# Patient Record
Sex: Female | Born: 1965 | Race: White | Hispanic: No | Marital: Married | Smoking: Never smoker
Health system: Southern US, Community
[De-identification: ages and names within clinical notes are randomized; demographics above are authoritative.]

## PROBLEM LIST (undated history)

## (undated) DIAGNOSIS — Z9889 Other specified postprocedural states: Secondary | ICD-10-CM

## (undated) DIAGNOSIS — N951 Menopausal and female climacteric states: Secondary | ICD-10-CM

## (undated) DIAGNOSIS — Z87442 Personal history of urinary calculi: Secondary | ICD-10-CM

## (undated) DIAGNOSIS — N200 Calculus of kidney: Secondary | ICD-10-CM

## (undated) DIAGNOSIS — I471 Supraventricular tachycardia, unspecified: Secondary | ICD-10-CM

## (undated) DIAGNOSIS — I10 Essential (primary) hypertension: Secondary | ICD-10-CM

## (undated) DIAGNOSIS — N1330 Unspecified hydronephrosis: Secondary | ICD-10-CM

## (undated) DIAGNOSIS — N390 Urinary tract infection, site not specified: Secondary | ICD-10-CM

## (undated) DIAGNOSIS — J45909 Unspecified asthma, uncomplicated: Secondary | ICD-10-CM

## (undated) DIAGNOSIS — T782XXA Anaphylactic shock, unspecified, initial encounter: Secondary | ICD-10-CM

## (undated) DIAGNOSIS — Z91018 Allergy to other foods: Secondary | ICD-10-CM

## (undated) DIAGNOSIS — R112 Nausea with vomiting, unspecified: Secondary | ICD-10-CM

## (undated) DIAGNOSIS — L720 Epidermal cyst: Secondary | ICD-10-CM

## (undated) HISTORY — PX: OTHER SURGICAL HISTORY: SHX169

## (undated) HISTORY — DX: Urinary tract infection, site not specified: N39.0

## (undated) HISTORY — DX: Unspecified asthma, uncomplicated: J45.909

## (undated) HISTORY — PX: TUBAL LIGATION: SHX77

## (undated) HISTORY — PX: ENDOMETRIAL ABLATION: SHX621

## (undated) HISTORY — DX: Anaphylactic shock, unspecified, initial encounter: T78.2XXA

## (undated) HISTORY — DX: Menopausal and female climacteric states: N95.1

## (undated) HISTORY — DX: Allergy to other foods: Z91.018

## (undated) HISTORY — DX: Epidermal cyst: L72.0

---

## 1999-06-18 ENCOUNTER — Encounter: Payer: Self-pay | Admitting: Emergency Medicine

## 1999-06-18 ENCOUNTER — Emergency Department (HOSPITAL_COMMUNITY): Admission: EM | Admit: 1999-06-18 | Discharge: 1999-06-18 | Payer: Self-pay | Admitting: Emergency Medicine

## 2000-11-13 ENCOUNTER — Ambulatory Visit (HOSPITAL_COMMUNITY): Admission: RE | Admit: 2000-11-13 | Discharge: 2000-11-13 | Payer: Self-pay | Admitting: *Deleted

## 2000-11-13 ENCOUNTER — Encounter: Payer: Self-pay | Admitting: Obstetrics and Gynecology

## 2002-08-01 ENCOUNTER — Observation Stay (HOSPITAL_COMMUNITY): Admission: AD | Admit: 2002-08-01 | Discharge: 2002-08-02 | Payer: Self-pay | Admitting: Obstetrics & Gynecology

## 2003-02-01 ENCOUNTER — Inpatient Hospital Stay (HOSPITAL_COMMUNITY): Admission: RE | Admit: 2003-02-01 | Discharge: 2003-02-03 | Payer: Self-pay | Admitting: Obstetrics and Gynecology

## 2006-01-28 ENCOUNTER — Ambulatory Visit (HOSPITAL_COMMUNITY): Admission: RE | Admit: 2006-01-28 | Discharge: 2006-01-28 | Payer: Self-pay | Admitting: Obstetrics and Gynecology

## 2007-01-12 ENCOUNTER — Emergency Department (HOSPITAL_COMMUNITY): Admission: EM | Admit: 2007-01-12 | Discharge: 2007-01-12 | Payer: Self-pay | Admitting: Emergency Medicine

## 2007-01-17 ENCOUNTER — Emergency Department (HOSPITAL_COMMUNITY): Admission: EM | Admit: 2007-01-17 | Discharge: 2007-01-17 | Payer: Self-pay | Admitting: Emergency Medicine

## 2007-04-16 ENCOUNTER — Other Ambulatory Visit: Admission: RE | Admit: 2007-04-16 | Discharge: 2007-04-16 | Payer: Self-pay | Admitting: Obstetrics and Gynecology

## 2007-04-22 ENCOUNTER — Ambulatory Visit (HOSPITAL_COMMUNITY): Admission: RE | Admit: 2007-04-22 | Discharge: 2007-04-22 | Payer: Self-pay | Admitting: Obstetrics & Gynecology

## 2007-07-02 ENCOUNTER — Ambulatory Visit (HOSPITAL_COMMUNITY): Admission: RE | Admit: 2007-07-02 | Discharge: 2007-07-02 | Payer: Self-pay | Admitting: Obstetrics and Gynecology

## 2007-07-15 ENCOUNTER — Ambulatory Visit (HOSPITAL_COMMUNITY): Admission: RE | Admit: 2007-07-15 | Discharge: 2007-07-15 | Payer: Self-pay | Admitting: Obstetrics and Gynecology

## 2007-07-15 ENCOUNTER — Encounter: Payer: Self-pay | Admitting: Obstetrics and Gynecology

## 2008-03-16 ENCOUNTER — Ambulatory Visit (HOSPITAL_COMMUNITY): Admission: RE | Admit: 2008-03-16 | Discharge: 2008-03-16 | Payer: Self-pay | Admitting: Family Medicine

## 2008-04-11 ENCOUNTER — Emergency Department (HOSPITAL_COMMUNITY): Admission: EM | Admit: 2008-04-11 | Discharge: 2008-04-11 | Payer: Self-pay | Admitting: Emergency Medicine

## 2008-04-12 ENCOUNTER — Emergency Department (HOSPITAL_COMMUNITY): Admission: EM | Admit: 2008-04-12 | Discharge: 2008-04-12 | Payer: Self-pay | Admitting: Emergency Medicine

## 2008-04-14 ENCOUNTER — Inpatient Hospital Stay (HOSPITAL_COMMUNITY): Admission: AD | Admit: 2008-04-14 | Discharge: 2008-04-19 | Payer: Self-pay | Admitting: Pulmonary Disease

## 2008-04-14 ENCOUNTER — Ambulatory Visit: Payer: Self-pay | Admitting: Pulmonary Disease

## 2008-04-14 ENCOUNTER — Encounter: Payer: Self-pay | Admitting: Emergency Medicine

## 2008-04-26 ENCOUNTER — Ambulatory Visit: Payer: Self-pay | Admitting: Internal Medicine

## 2008-04-26 DIAGNOSIS — J189 Pneumonia, unspecified organism: Secondary | ICD-10-CM | POA: Insufficient documentation

## 2008-04-26 DIAGNOSIS — M549 Dorsalgia, unspecified: Secondary | ICD-10-CM | POA: Insufficient documentation

## 2008-04-26 DIAGNOSIS — B370 Candidal stomatitis: Secondary | ICD-10-CM | POA: Insufficient documentation

## 2008-04-26 DIAGNOSIS — N12 Tubulo-interstitial nephritis, not specified as acute or chronic: Secondary | ICD-10-CM | POA: Insufficient documentation

## 2008-04-26 DIAGNOSIS — A419 Sepsis, unspecified organism: Secondary | ICD-10-CM | POA: Insufficient documentation

## 2008-04-26 DIAGNOSIS — N200 Calculus of kidney: Secondary | ICD-10-CM | POA: Insufficient documentation

## 2008-04-26 DIAGNOSIS — R0989 Other specified symptoms and signs involving the circulatory and respiratory systems: Secondary | ICD-10-CM | POA: Insufficient documentation

## 2008-04-26 DIAGNOSIS — R0609 Other forms of dyspnea: Secondary | ICD-10-CM

## 2008-05-10 ENCOUNTER — Ambulatory Visit: Payer: Self-pay | Admitting: Pulmonary Disease

## 2008-05-24 ENCOUNTER — Ambulatory Visit (HOSPITAL_COMMUNITY): Admission: RE | Admit: 2008-05-24 | Discharge: 2008-05-24 | Payer: Self-pay | Admitting: Urology

## 2009-01-29 ENCOUNTER — Emergency Department (HOSPITAL_COMMUNITY): Admission: EM | Admit: 2009-01-29 | Discharge: 2009-01-29 | Payer: Self-pay | Admitting: Family Medicine

## 2009-01-31 ENCOUNTER — Emergency Department (HOSPITAL_COMMUNITY): Admission: EM | Admit: 2009-01-31 | Discharge: 2009-01-31 | Payer: Self-pay | Admitting: Emergency Medicine

## 2009-03-22 ENCOUNTER — Ambulatory Visit (HOSPITAL_COMMUNITY): Admission: RE | Admit: 2009-03-22 | Discharge: 2009-03-22 | Payer: Self-pay | Admitting: Obstetrics and Gynecology

## 2009-03-30 ENCOUNTER — Encounter: Admission: RE | Admit: 2009-03-30 | Discharge: 2009-03-30 | Payer: Self-pay | Admitting: Obstetrics and Gynecology

## 2010-01-23 ENCOUNTER — Emergency Department (HOSPITAL_COMMUNITY): Admission: EM | Admit: 2010-01-23 | Discharge: 2010-01-23 | Payer: Self-pay | Admitting: Emergency Medicine

## 2010-06-29 ENCOUNTER — Encounter: Payer: Self-pay | Admitting: Cardiology

## 2010-06-30 DIAGNOSIS — I498 Other specified cardiac arrhythmias: Secondary | ICD-10-CM | POA: Insufficient documentation

## 2010-07-02 ENCOUNTER — Encounter: Payer: Self-pay | Admitting: Obstetrics and Gynecology

## 2010-07-03 ENCOUNTER — Ambulatory Visit
Admission: RE | Admit: 2010-07-03 | Discharge: 2010-07-03 | Payer: Self-pay | Source: Home / Self Care | Attending: Cardiology | Admitting: Cardiology

## 2010-07-03 ENCOUNTER — Encounter: Payer: Self-pay | Admitting: Cardiology

## 2010-07-03 DIAGNOSIS — R002 Palpitations: Secondary | ICD-10-CM | POA: Insufficient documentation

## 2010-07-11 ENCOUNTER — Ambulatory Visit (HOSPITAL_COMMUNITY)
Admission: RE | Admit: 2010-07-11 | Discharge: 2010-07-11 | Payer: Self-pay | Source: Home / Self Care | Attending: Cardiology | Admitting: Cardiology

## 2010-07-11 ENCOUNTER — Ambulatory Visit: Admission: RE | Admit: 2010-07-11 | Discharge: 2010-07-11 | Payer: Self-pay | Source: Home / Self Care

## 2010-07-11 DIAGNOSIS — R002 Palpitations: Secondary | ICD-10-CM

## 2010-07-13 NOTE — Assessment & Plan Note (Signed)
Summary: np6/svt-mb   Visit Type:  Initial Consult Primary Provider:  Dr. Gerda Diss  CC:  Pt has no complaints today. pt has lot of stress right now..  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 Gerda Diss for palpitations and question of SVT.  She has never had any previous cardiac problems. On January 3, she woke up and felt unsteady on her gait. By the time she got around to the end of her bed she began to feel her heart race. It was regular and started suddenly. After she was able to bear down it broke.  She has not had a recurrence. She saw her primary care to check blood work. I do not have those results. She was found to be hypertensive and she was started on metoprolol tartrate 25 mg a day. Her pressures have been about 1:30 to 140/80.  She denies next is caffeine use, does not smoke, rarely drinks more than a glass of wine a couple days a week, and no over-the-counter meds used including pseudoephedrine or decongestants.  She struggles with her weight and she rarely exercises. She has been a lot of domestic stress with her son.  The morning that she had the SVT she had been working a lot of hours over the holidays.  EKG today demonstrates normal sinus rhythm with normal intervals.  I just received her laboratory data shows hemoglobin 13.8, potassium 4.1, blood sugar 108, cholesterol 202, triglycerides at 212, LDL 120, HDL 40, normal LFTs, TSH was normal.  Current Medications (verified): 1)  Ibuprofen 800 Mg Tabs (Ibuprofen) .Marland Kitchen.. 1 Tablet Two Times A Day By Mouth As Needed 2)  Aspirin 81 Mg Tbec (Aspirin) .... Take One Tablet By Mouth Daily 3)  Metoprolol Tartrate 25 Mg Tabs (Metoprolol Tartrate) .... Once Daily  Allergies: 1)  ! Sulfa 2)  ! * Onions  Past History:  Past Medical History: Last updated: 06/30/2010 Current Problems:  SUPRAVENTRICULAR TACHYCARDIA  (ICD-427.89) PNEUMONIA (ICD-486) Hx of RESPIRATORY DISTRESS (ICD-786.09) BACK PAIN, CHRONIC (ICD-724.5) THRUSH (ICD-112.0) Hx of SEPSIS (ICD-995.91) Hx of PYELONEPHRITIS (ICD-590.80) NEPHROLITHIASIS (ICD-592.0)     ALLERGIES:  Listed as SULFA.      FAMILY HISTORY:  Positive for nephrolithiasis.   Past Surgical History: Last updated: 06/30/2010  1. Cystoscopy.   2. Removal of right ureteral stent.   3. Right ureteroscopy with stone removal.      SURGEON:  Heloise Purpura, MD.   Family History: Last updated: 04/26/2008 positive for kidney stone positive for heart disease positive for hypertension positive for breast cancer  Social History: Last updated: 04/26/2008 never smoked children rare etoh  Risk Factors: Smoking Status: never (04/26/2008)  Review of Systems       negative other than history of present illness  Vital Signs:  Patient profile:   45 year old female Height:      62 inches Weight:      166.75 pounds BMI:     30.61 Pulse rate:   61 / minute Pulse rhythm:   irregular BP sitting:   148 / 80  (left arm) Cuff size:   large  Vitals Entered By: Danielle Rankin, CMA (July 03, 2010 3:09 PM)  Physical Exam  General:  no acute distress, overweight Head:  normocephalic and atraumatic Eyes:  PERRLA/EOM intact; conjunctiva and lids normal. Neck:  Neck supple, no JVD. No masses, thyromegaly or abnormal cervical nodes. Chest Jalene Demo:  no deformities  or breast masses noted Lungs:  Clear bilaterally to auscultation and percussion. Heart:  PMI nondisplaced regular rate and rhythm normal S1-S2, no click or murmur. Carotids show cecal by without bruits Abdomen:  soft, good bowel sounds, no bruit Msk:  Back normal, normal gait. Muscle strength and tone normal. Pulses:  pulses normal in all 4 extremities Extremities:  No clubbing or cyanosis. Neurologic:  Alert and oriented x 3.   Impression & Recommendations:  Problem # 1:  SUPRAVENTRICULAR TACHYCARDIA  (ICD-427.89) Assessment New Her event sounds like SVT. She has not had a recurrence. Would check her thyroid studies and other blood work from primary care. Will obtain 2-D echocardiogram to rule out structural heart disease. I will increase her metoprolol tartrate b.i.d. If she begins to have more frequent episodes, or a prolonged episode, we'll try to obtain a monitor or EKG during her event respectively. Have encouraged exercise for stress reduction. Her updated medication list for this problem includes:    Aspirin 81 Mg Tbec (Aspirin) .Marland Kitchen... Take one tablet by mouth daily    Metoprolol Tartrate 25 Mg Tabs (Metoprolol tartrate) .Marland Kitchen... Take one tablet by mouth twice a day  Orders: EKG w/ Interpretation (93000)  Problem # 2:  PALPITATIONS (ICD-785.1) Assessment: New  Her updated medication list for this problem includes:    Aspirin 81 Mg Tbec (Aspirin) .Marland Kitchen... Take one tablet by mouth daily    Metoprolol Tartrate 25 Mg Tabs (Metoprolol tartrate) .Marland Kitchen... Take one tablet by mouth twice a day  Orders: Echocardiogram (Echo)  Patient Instructions: 1)  Your physician recommends that you schedule a follow-up appointment in: 12 months or as needed 2)  Your physician has recommended you make the following change in your medication: Increase metoprolol tartrate to 25 mg by mouth two times a day 3)  Your physician has requested that you have an echocardiogram.  Echocardiography is a painless test that uses sound waves to create images of your heart. It provides your doctor with information about the size and shape of your heart and how well your heart's chambers and valves are working.  This procedure takes approximately one hour. There are no restrictions for this procedure. Prescriptions: METOPROLOL TARTRATE 25 MG TABS (METOPROLOL TARTRATE) Take one tablet by mouth twice a day  #60 x 11   Entered by:   Dossie Arbour, RN, BSN   Authorized by:   Gaylord Shih, MD, [facility]   Signed by:   Dossie Arbour, RN, BSN on 07/03/2010   Method used:   Electronically to        [facility]* (retail)       [address]       [address] Shipping/mailing       [place], [place]  [address]       Ph: [phone]       Fax: [phone]   RxID:   [id number]

## 2010-09-16 LAB — WOUND CULTURE: Gram Stain: NONE SEEN

## 2010-09-16 LAB — BASIC METABOLIC PANEL
BUN: 10 mg/dL (ref 6–23)
Chloride: 105 mEq/L (ref 96–112)
GFR calc non Af Amer: >60 mL/min (ref >60)
Glucose, Bld: 105 mg/dL — ABNORMAL HIGH (ref 70–99)
Sodium: 139 mEq/L (ref 135–145)

## 2010-10-08 IMAGING — CR DG CHEST 1V PORT
1 series · 1 of 1 positions shown · non-contrast
Comparison: 04/14/2008 study

CLINICAL DATA: History given of sepsis and pneumonia.

PORTABLE CHEST - 1 VIEW

[view not recorded]
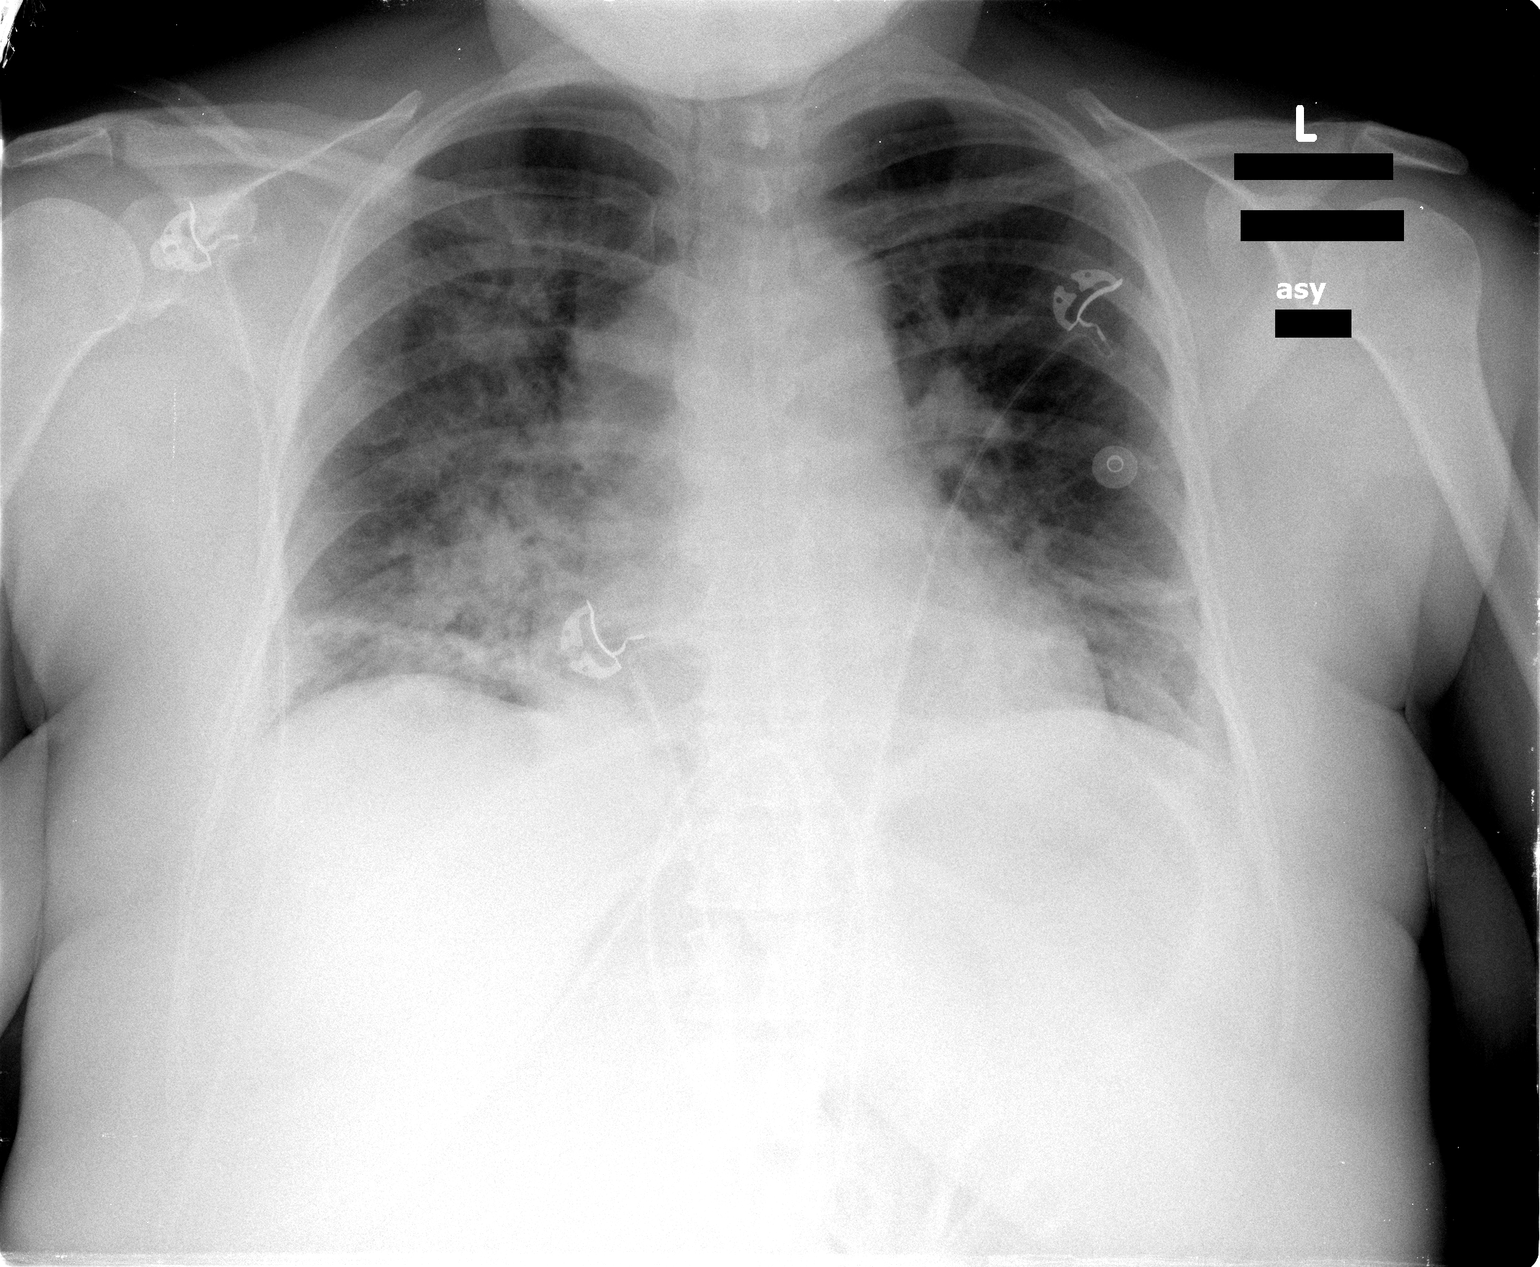

[1 of 1 positions shown; findings below may reference images not displayed]

FINDINGS: There is moderate enlargement of the cardiac silhouette.
Patchy infiltrative densities and airspace opacities are present
throughout the right lung with the densest involvement in the right
lung base with associated right basilar atelectasis.  Subsegmental
atelectasis is present in the lower left lung zone.  No left
pleural effusion is seen.  There is slight indistinctness of the
right costophrenic angle which may reflect a trace amount of right
pleural effusion.  Osteophytes are seen in the spine.
IMPRESSION: There is moderate enlargement of the cardiac silhouette.  Pulmonary
infiltrative densities and airspace disease opacities have
increased in the right lung since prior study.  There is associated
right basilar atelectasis with possible trace amount of right
pleural effusion.  There is now subsegmental atelectasis in the
lower left lung zone.

## 2010-10-09 IMAGING — CR DG ABD PORTABLE 1V
1 series · 1 of 1 positions shown · non-contrast
Comparison: 04/12/2008

CLINICAL DATA: Ileus.  Sepsis.

ABDOMEN - 1 VIEW

[view not recorded]
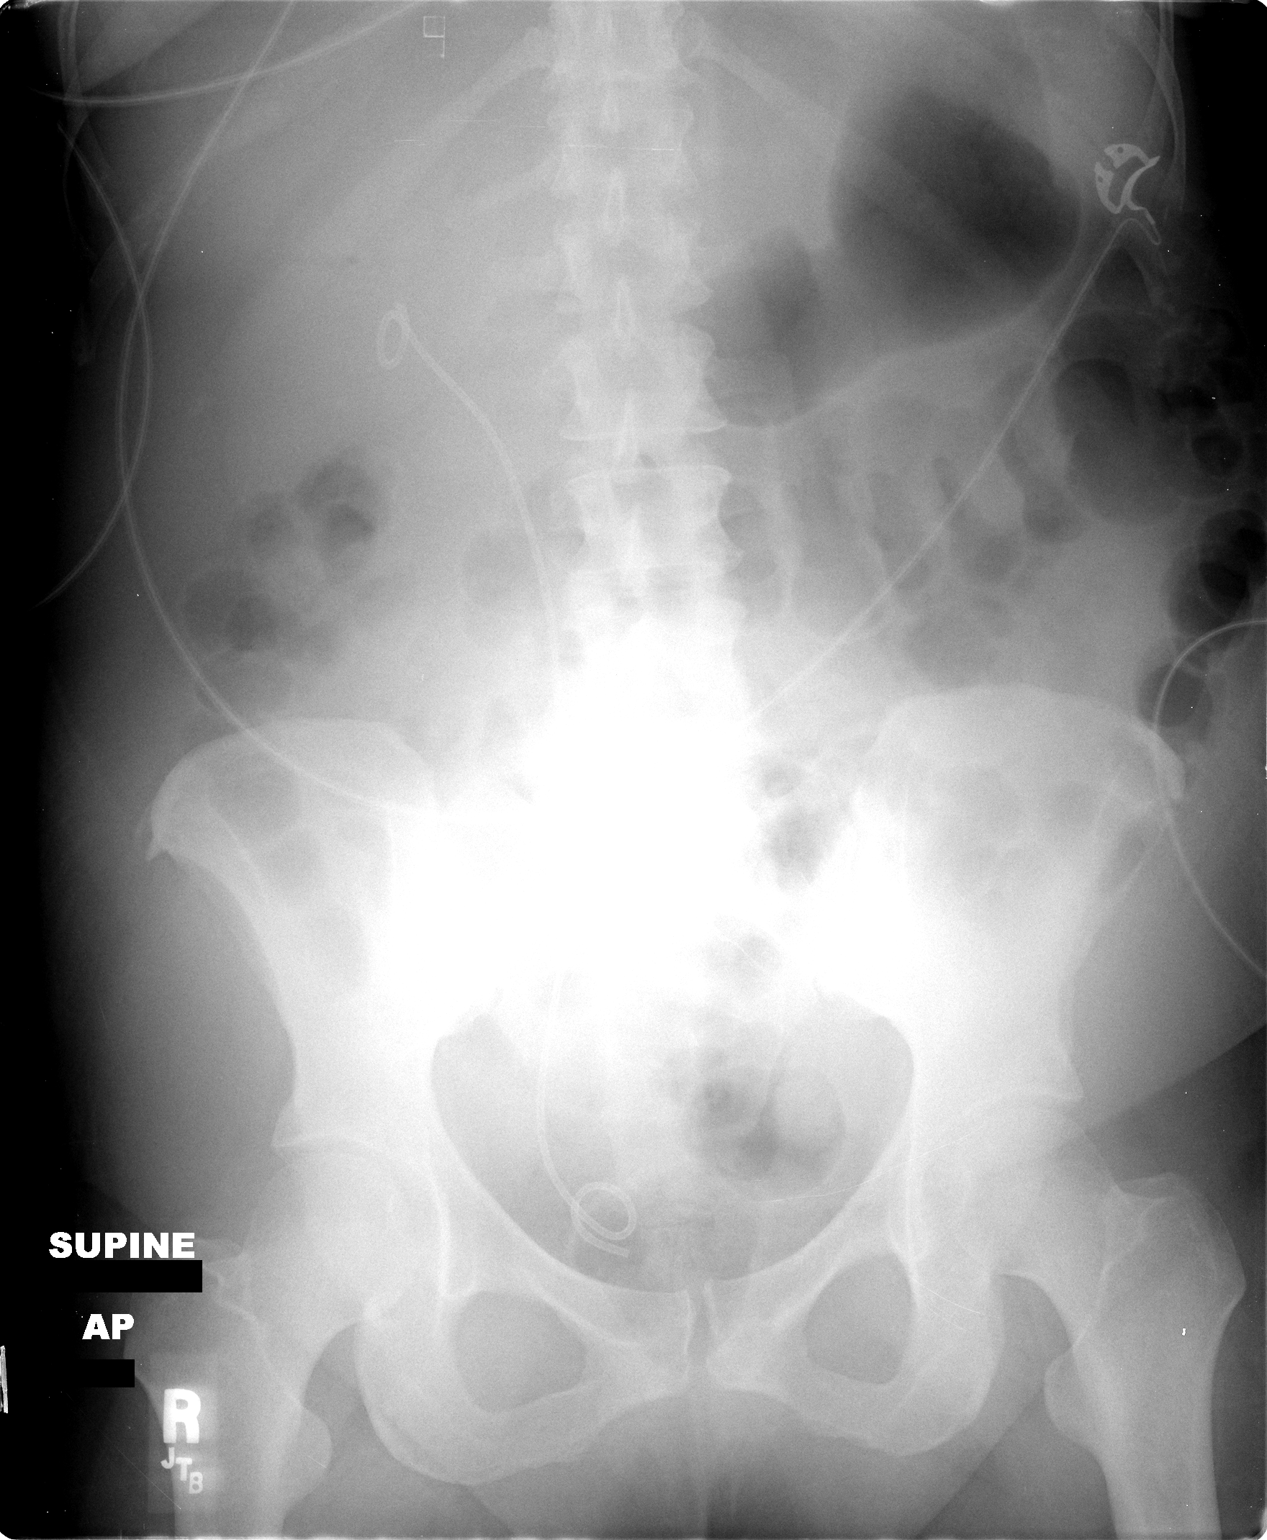

[1 of 1 positions shown; findings below may reference images not displayed]

FINDINGS: There is been placement of a right ureteral stent since
prior study.  The bowel gas pattern is normal.  There is no
evidence of dilated bowel loops.
IMPRESSION: Normal bowel gas pattern.  Right ureteral stent in appropriate
position.

## 2010-10-24 NOTE — Discharge Summary (Signed)
NAMEKORISSA, Harrison NO.:  [id number]   MEDICAL RECORD NO.:  [id number]          PATIENT TYPE:  INP   LOCATION:  5114                         FACILITY:  [facility]   PHYSICIAN:  Oretha Milch, MD      DATE OF BIRTH:  02-02-1966   DATE OF ADMISSION:  04/14/2008  DATE OF DISCHARGE:  04/19/2008                               DISCHARGE SUMMARY   DISCHARGE DIAGNOSES:  1. E. coli pyelonephritis.  2. Renal calculi.  3. Systemic inflammatory response syndrome.  4. Thrush.   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  She underwent examination  at a local emergency room, went home, developed fevers, chills, and  sweats, and came in and required admission for increasing fever,  hypotension.   PAST MEDICAL HISTORY:  Significant for chronic back pain.   ALLERGIES:  Listed as SULFA.   FAMILY HISTORY:  Positive for nephrolithiasis.   LABORATORY DATA:  Chest x-ray shows patchy opacity in the right lung  concerning for infectious etiology, which may be a component of mild  edema.  Temperature was 98.2 on the day of discharge, pulse 61,  respirations 18, systolic is 179, diastolic is 92, O2 saturation is 98%  on room air.  WBCs 6.5, hemoglobin 9.5, hematocrit 27.6, platelets  144,000, INR 1.1, PT 38.  CBG is 120, sodium 141, potassium 3.7,  chloride 106, CO2 29, glucose 123, BUN 5, creatinine 0.72, calcium 9.2,  phosphorus 3.8, magnesium 1.7.   HOSPITAL COURSE BY DISCHARGE DIAGNOSIS:  1. Pneumonia and urinary tract infection with E. coli.  She was      admitted to [facility] and started on antimicrobial      therapy.  Culture data is remarkable for urine culture showing E.      coli.  Legionella urine was negative.  Blood cultures were negative      x2.  She was admitted for pneumonia and urinary tract infection,      started antimicrobial therapy and responded well to therapy.  She  did require fluid resuscitation for systemic inflammatory response      syndrome.  She reached maximal hospital benefit by April 19, 2008      and was to be discharged home.  She will continue another 14 days      of antimicrobial therapy.  2. Nephrolithiasis.  She has a right kidney stone.  She was evaluated      by Heloise Purpura, M.D.  She underwent a stent per him to the right.      She will be continued on antibiotics for approximately 2 weeks and      will follow up with him on an outpatient basis.  3. Thrush.  She was started on nystatin and then changed to Diflucan      100 mg p.o. daily for 7 days and to follow up with Dr. Vassie Loll in the      near future.   DISCHARGE MEDICATIONS:  1. Ceftin 500 mg  1 b.i.d. for the next 14 days.  2. Tylenol 650 mg every 6 hours as needed.  3. Diflucan 100 mg 1 p.o. x7 days.   She has a followup appointment with nurse practitioner Parrett on  April 26, 2008 at 10:30 a.m. and with Dr. Laverle Patter in the near future  for evaluation of her stent.   DISPOSITION/CONDITION ON DISCHARGE:  Improved.  She will be on a heart-  healthy, low-sodium diet.  She has instructions as far as her stent per  Dr. Laverle Patter.  She is discharged home in improved condition.      Devra Dopp, MSN, ACNP      Oretha Milch, MD  Electronically Signed    SM/MEDQ  D:  04/19/2008  T:  04/19/2008  Job:  [id number]

## 2010-10-24 NOTE — Op Note (Signed)
Judy Harrison, Judy Harrison NO.:  [id number]   MEDICAL RECORD NO.:  [id number]          PATIENT TYPE:  AMB   LOCATION:  DAY                           FACILITY:  [facility]   PHYSICIAN:  Tilda Burrow, M.D. DATE OF BIRTH:  19-May-1966   DATE OF PROCEDURE:  07/15/2007  DATE OF DISCHARGE:                               OPERATIVE REPORT   PREOPERATIVE DIAGNOSES:  1. Left ovarian dermoid.  2. Menorrhagia.   POSTOPERATIVE DIAGNOSES:  1. Left ovarian dermoid.  2. Menorrhagia.   PROCEDURES:  1. Laparoscopic left ovarian cystectomy for dermoid.  2. Hysteroscopy and endometrial ablation.   SURGEON:  Ferguson.   ASSISTANT:  Blackwell, CST.   ANESTHESIA:  General  Idacavage, CRNA.   COMPLICATIONS:  None.   FINDINGS:  Anterior retroverted retroflexed uterus sounding to 9 cm.  Smooth cystic appearance to the left ovary with dermoid within its  central portions. Shelled out nicely.   DETAILS OF PROCEDURE:  The patient was taken to the operating room  prepped and draped for a combined abdominal and vaginal procedure with  the Foley catheter inserted and clear urine obtained. A Hulka tenaculum  was attached to the cervix for uterine manipulation. An infraumbilical  vertical skin incision was made as well as a transverse suprapubic 1 cm  incision.  Veress needle was introduced through the umbilicus without  difficulty. Pneumoperitoneum achieved. Under 6 mmHg pressure after a  water droplet test confirmed peritoneal location of needle. The patient  had easy insufflation of the abdomen, insertion of the laparoscopic  trocar under direct visualization and inspection of the pelvis.  Suprapubic and right lower quadrant trocars were placed under direct  visualization, 5 mm in the right lower quadrant and a 12 mm at the  suprapubic area. Attention was directed to the left ovary, which was  further documented as being visually smooth, slightly enlarged with no  grossly abnormal  findings.  Aspiration of cystic structure drained  approximately 10 cc of liquid waxy material, which then coalesced when  allowed to cool to room air. The ovary was opened on its medial surface,  the cyst grasped and using traction and counter traction, we were able  to peel the cystic structure out of the center of the ovary.  Unipolar  cautery was used as necessary to complete hemostasis as we completed the  extraction process.  There was minimal bleeding. The cyst was removed  laparoscopically and photo #4 shows the postprocedure appearance of the  ovary.  The abdomen was deflated. Instruments were removed. Subcuticular  4-0 Dexon closure of all three skin incisions performed after 0 Vicryl  closure of the suprapubic and umbilical sites under careful direct  visualization.   Endometrial ablation. Attention was then moved to the perineum, where  the speculum was inserted. The cervix was grasped with a single tooth  tenaculum, sounded in the anteverted, retroflexed flexion to 9 cm with  dilation to 23 [place] allowing introduction of the rigid hysteroscope.  Photos 1 and 2 showed the cornu of the uterus, which was very thin  without  any polyps or gross abnormalities.  The patient was considered  an excellent candidate for ablation. She had just finished her menses,  was on Progestin, so there was no significant tissue build up. The  endometrial ablation Gyne-Care ThermaChoice Three device was inserted  and insufflated with 12 cc of D5W in the eight minute thermal sequence  performed. 19 cc of Marcaine with Epinephrine was injected in the  paracervical block and the procedure considered successfully completed.  Sponge and needle counts were correct. The patient went to the recovery  room in good condition.  Discharge medicines include Vicodin times 20  tablets and Motrin times 20 tablets.      Tilda Burrow, M.D.  Electronically Signed     JVF/MEDQ  D:  07/15/2007  T:   07/15/2007  Job:  [id number]   cc:   [facility]

## 2010-10-24 NOTE — Consult Note (Signed)
Judy Harrison, Judy Harrison NO.:  [id number]   MEDICAL RECORD NO.:  [id number]          PATIENT TYPE:  INP   LOCATION:  2101                         FACILITY:  [facility]   PHYSICIAN:  Heloise Purpura, MD      DATE OF BIRTH:  04-Oct-1965   DATE OF CONSULTATION:  04/14/2008  DATE OF DISCHARGE:                                 CONSULTATION   REASON FOR CONSULTATION:  1. Right ureteral calculus.  2. Fever.   HISTORY:  Judy Harrison is a 45 year old female seen in consultation at the  request of Dr. Billy Fischer.  She has been having right-sided flank  pain for approximately 48-72 hours.  She did undergo a CT scan after  presenting to the emergency department on Sunday, which demonstrated a 3-  mm proximal right ureteral calculus with associated hydronephrosis.  Over the next 48 hours, she continued to have significant pain and did  begin to develop fever up to 101 degrees Fahrenheit.  She again  presented to the emergency department earlier today due to her  persistent fever and the fact that her pain was becoming less controlled  and she was feeling poorly overall.  Upon evaluation in the emergency  department, she was found to be tachycardiac with a heart rate in the  140s.  She was also found to be febrile with a temperature of 101  degrees Fahrenheit.  Based on concern for sepsis, she was admitted to  the intensive care unit under the Critical Care Service.  There was also  concern for respiratory distress versus pneumonia.  She denies a prior  history of kidney stones, hematuria, GU malignancy/trauma/prior surgery  except for tubal ligation and C-section.  She currently describes her  pain is moderately severe with some radiation to her right upper  abdomen.   PAST MEDICAL HISTORY:  Chronic back pain.   PAST SURGICAL HISTORY:  Tubal ligation and C-section.   MEDICATIONS:  1. Aspirin.  2. Zyrtec.   ALLERGIES:  SULFA.   FAMILY HISTORY:  Positive for nephrolithiasis.   SOCIAL HISTORY:  She denies tobacco use.   REVIEW OF SYSTEMS:  Positive for cough and mild shortness of breath as  well as fever and right-sided flank pain.  All other systems are  reviewed and otherwise negative.   PHYSICAL EXAMINATION:  VITALS:  Temperature currently is 99.7 degrees  Fahrenheit.  Her temperature earlier today was 101 degrees Fahrenheit.  Heart rate 106 and blood pressure 158/78.  CONSTITUTIONAL:  The patient is a well-nourished, well-developed, age-  appropriate female in no acute distress.  HEENT:  Normocephalic and atraumatic.  NECK:  No JVD.  CARDIOVASCULAR:  Regular rate and rhythm without obvious murmurs.  LUNGS:  Clear bilaterally.  ABDOMEN:  Soft and nondistended without abdominal masses.  She does have  mild-to-moderate right sided abdominal pain without rebound, tenderness,  or guarding.  She does have mild-to-moderate right-sided CVA tenderness.  EXTREMITIES:  No edema.  NEUROLOGIC:  No focal deficits.  GU:  Normal external female genitalia.  PSYCHIATRIC:  Normal mood and affect.   LABORATORY STUDIES:  Urinalysis demonstrates a pH of 6.0 with too  numerous to count white blood cells, few bacteria, and 7-10 red blood  cells.  Serum creatinine 1.28.  White blood count 2.5, hemoglobin 11.9,  and platelets 103.  Urine pregnancy negative.   RADIOLOGIC IMAGING:  The following studies were independently reviewed.  The patient's stone CT from April 11, 2008, demonstrated a 3-mm right  UPJ calculus with associated hydronephrosis.  Her KUB from April 12, 2008, demonstrates a radiopaque calcification that is consistent with  her stone overlying the expected course of the proximal right ureter.  Her renal ultrasound from April 14, 2008, demonstrates mild right-  sided hydronephrosis consistent with persistent obstruction.   IMPRESSION:  Right ureteral calculus with ureteral obstruction and signs  of sepsis.   PLAN:  I have had a discussion with the  patient and her husband today.  I have recommended that she undergo drainage of her right kidney.  We  have discussed proceeding with ureteral stent versus percutaneous  nephrostomy tube placement.  I have discussed her situation with Dr.  Levy Pupa who is on call for the Critical Care Service this evening.  He thinks that she would be an acceptable candidate to take to the  operating room to proceed with ureteral stent placement.  We may attempt  to perform this under a MAC anesthetic to avoid a general endotracheal  anesthetic pending anesthesia's recommendations.  We have discussed the  potential risks, complications, and alternative options associated with  this procedure.  This will be performed on an urgent basis this evening.      Heloise Purpura, MD  Electronically Signed     LB/MEDQ  D:  04/14/2008  T:  04/15/2008  Job:  [id number]   cc:   Oley Balm. Sung Amabile, MD  Leslye Peer, MD

## 2010-10-24 NOTE — Op Note (Signed)
NAMEDANGELA, HOW NO.:  [id number]   MEDICAL RECORD NO.:  [id number]          PATIENT TYPE:  AMB   LOCATION:  DAY                          FACILITY[facility]   PHYSICIAN:  Heloise Purpura, MD      DATE OF BIRTH:  [birth year]   DATE OF PROCEDURE:  05/24/2008  DATE OF DISCHARGE:                               OPERATIVE REPORT   PREOPERATIVE DIAGNOSIS:  Right ureteropelvic junction calculus.   POSTOPERATIVE DIAGNOSIS:  Right renal calculus.   PROCEDURES:  1. Cystoscopy.  2. Removal of right ureteral stent.  3. Right ureteroscopy with stone removal.   SURGEON:  Heloise Purpura, MD.   ASSISTANT:  Duane Boston, MD.   ANESTHESIA:  LMA.   INDICATION:  Ms. Canty is a 45 year old female, who initially presented  to the hospital in sepsis and was found to have an obstructing right  ureteral calculus.  She was placed on broad-spectrum IV antibiotics  pending her urine culture results and underwent urgent ureteral stent  placement.  She subsequently defervesced and was able to be discharged  from the hospital.  She was treated with Ceftin for 2 weeks.  In  addition, there was some concern regarding pneumonia and she was also  treated for this.  Once she had fully recovered from both her renal and  pulmonary infections, she was felt to be stable to proceed with  definitive stone management.  We discussed options, and she elected to  proceed with the above procedures.  Potential risks, complications, and  alternative options were discussed in detail with the patient and  informed consent was obtained.   DESCRIPTION OF PROCEDURE:  The patient was taken to the operating room  and an LMA was placed.  She was placed in the dorsal lithotomy position,  prepped and draped in the usual sterile fashion, and preoperative  antibiotics were administered.  A preoperative timeout was performed.  A  cystourethroscopy was then performed, which allowed visualization of the  patient's  right ureteral stent.  This was brought out through the  urethral meatus with the aid of the flexible graspers.  A 0.38 sensor  guidewire was then advanced through the stent up into the right renal  pelvis under fluoroscopic guidance.  The semi-rigid ureteroscope was  then used to examine the ureter along the safety guidewire up to the  proximal ureter.  No stone was visualized indicating that the stone  likely had migrated back into the kidney.  Therefore, a second 0.38  Glidewire was inserted through the semi-rigid ureteroscope and up into  the right renal pelvis.  A 6.5-French flexible ureteroscope was then  advanced over the Glidewire into the renal pelvis.  The patient's stone  was identified and was able to be removed with the aid of a Nitanol  basket.  The patient was noted to only have a solitary calculus on her  recent CT scan.  Due to the fact that the procedure was uneventful,  it was decided to leave her ureteral stent out.  Therefore, the  remaining guidewire was removed, and the  patient's bladder was emptied.  She tolerated the procedure well and without complications.  She was  able to be awakened and transferred to the recovery room in satisfactory  condition.      Heloise Purpura, MD  Electronically Signed     LB/MEDQ  D:  05/24/2008  T:  05/24/2008  Job:  [id number]

## 2010-10-24 NOTE — Op Note (Signed)
Judy Harrison, Judy Harrison.:  [id number]   MEDICAL RECORD Harrison.:  [id number]          PATIENT TYPE:  INP   LOCATION:  2101                         FACILITY:  [facility]   PHYSICIAN:  Heloise Purpura, MD      DATE OF BIRTH:  1966/03/24   DATE OF PROCEDURE:  04/14/2008  DATE OF DISCHARGE:                               OPERATIVE REPORT   PREOPERATIVE DIAGNOSES:  1. Right ureteral stone with ureteral obstruction.  2. Fever/sepsis.   POSTOPERATIVE DIAGNOSES:  1. Right ureteral stone with ureteral obstruction.  2. Fever/sepsis.   PROCEDURE:  1. Cystoscopy.  2. Right ureteral stent placement (6 x 24).   SURGEON:  Heloise Purpura, MD   ANESTHESIA:  MAC.   INDICATIONS:  Mr. Demeyer is a 45 year old female who presented with right-  sided flank pain and signs of sepsis.  She was evaluated and found to  have a right ureteral stone and infection.  She was admitted to the  intensive care unit and managed by the critical care team.  A urologic  consultation was subsequently obtained.  Based on the fact that she did  have ureteral obstruction and fever, it was determined that she should  be taken to the operating room on an urgent basis for ureteral stent  placement.  The potential risks, complications, and alternative options  were discussed in detail and informed consent was obtained.   DESCRIPTION OF PROCEDURE:  The patient was taken to the operating room  and MAC anesthetic was induced.  The patient was on broad-spectrum IV  antibiotics preoperatively.  She was placed in the dorsal lithotomy  position and prepped and draped in the usual sterile fashion.  Next,  preoperative time-out was performed.  Cystourethroscopy was then  performed, which demonstrated a normal urethra and bladder without  evidence of any bladder tumors, stones, or other mucosal pathology.  The  ureteral orifices were identified in the normal anatomic position.  Attention then turned to the right ureteral  orifice, which was  identified and intubated with a 0.038 sensor guidewire.  Under  fluoroscopic guidance, the wire was then advanced up the right ureter  and up into the right kidney under fluoroscopic guidance.  A 6 x 24  double-J ureteral stent was then advanced over the wire using Seldinger  technique and appropriately positioned under fluoroscopic and  cystoscopic guidance.  The wire was removed with a good curl  noted in the renal pelvis as well as in the bladder.  A 16-French Foley  catheter was then placed in the bladder and the procedure was ended.  She tolerated the procedure well without complications.  She was able to  be transferred to the recovery room in satisfactory condition.      Heloise Purpura, MD  Electronically Signed     LB/MEDQ  D:  04/14/2008  T:  04/15/2008  Job:  [id number]

## 2010-10-24 NOTE — H&P (Signed)
NAMEDALONDA, Judy Harrison NO.:  [id number]   MEDICAL RECORD NO.:  [id number]          PATIENT TYPE:  AMB   LOCATION:  DAY                           FACILITY:  [facility]   PHYSICIAN:  Tilda Burrow, M.D. DATE OF BIRTH:  Mar 27, 1966   DATE OF ADMISSION:  DATE OF DISCHARGE:  LH                              HISTORY & PHYSICAL   PREADMISSION DIAGNOSES:  1. Menorrhagia, dysmenorrhea.  2. Small left ovarian dermoid cyst.   HISTORY OF PRESENT ILLNESS:  This 45 year old female status post tubal  ligation is admitted at this time for laparoscopic evaluation of her  left ovary s well as hysteroscopy, D&C and endometrial ablation.  Judy Harrison  has been followed in our office for heavy periods.  She has had  ultrasound in November suggesting a uterus measuring 9.1 x 6.2 x 5.5 cm  in diameter with essentially normal endometrium.  She does not wish to  have long-term hormone therapy for the heavy periods which are  incapacitating lasting up to 7 days.  The periods are accompanied by  cramping and intermittent lower abdominal pain.  She has had occasional  breakthrough bleeding.  Pap smear has been normal as has Pap smear has  been normal as April 16, 2007.   The ultrasound also identified a 10 x 10 x 11 mm area in the left ovary  which was felt to represent a small dermoid.  In addition the recent  exam shows a complicated left ovarian mass effect measuring 3.2 x 3.0 x  3.2 cm felt to represent possibly a fat deposit.  The total diameter of  the left ovary was 4.2 x 3.1 cm in maximum diameters.  There is minimal  pelvic fluid.  Uterus is unchanged.  Plans are to proceed with  laparoscopic removal of the cystic structure on the left, presumably a  dermoid.  The patient is aware that final pathology report will be  pending and that it may become necessary to remove the entire left tube  and ovary depending on surgical success.   Endometrial ablation as been discussed with Gynecare  ThermaChoice III  brochures reviewed by the patient as well as internet information sought  out by the patient.  The pros and cons of the procedure have been  reviewed with the patient.   PAST MEDICAL HISTORY:  Notable for intermittent back, pain mild stress  urinary incontinence not currently warranting therapy, intermittent  right lower quadrant discomfort, unknown etiology.   SURGICAL HISTORY:  1. Cryocautery of the cervix in 1986.  2. Cesarean section in 1993 for breech.  3. Additional C-sections in 1998 and 2004 at which time tubal ligation      was performed.   ALLERGIES:  SHE HAS ALLERGIES TO SULFA.   FAMILY HISTORY:  Positive for breast cancer, hypertension, heart  disease.  Paps have been normal since 1980s during the time at [facility].   MEDICATIONS:  Augmentin for recent upper respiratory infection.   Cigarettes denied.  Alcohol rare.  Recreational drugs denied.   PHYSICAL EXAMINATION:  GENERAL:  Shows a healthy Caucasian female,  weight 176, blood pressure 126/82.  Last menstrual period began July 03, 2007.  HEENT:  Pupils equal, round, reactive.  Extraocular movements intact.  NECK:  Supple.  CHEST:  Clear to auscultation.  ABDOMEN:  Nontender.  Moderate abdominal thickness.  EXTERNAL GENITALIA:  Multiparous.  VAGINAL EXAM:  Normal secretions.  Cervix nonpurulent.  Uterus  anteflexed, within normal limits as adnexa, well supported and difficult  to assess on bimanual exam but noted on ultrasound to contain the  previously mentioned cystic structure felt to represent a dermoid.  EXTREMITIES:  Grossly normal without gross abnormalities.   PLAN:  1. Laparoscopy, removal of left ovarian dermoid cyst, possible removal      of left tube and ovary.  2. Hysteroscopy, D&C, endometrial ablation to be performed at 07:30      a.m. July 15, 2007.      Tilda Burrow, M.D.  Electronically Signed     JVF/MEDQ  D:  07/08/2007  T:  07/09/2007  Job:   [id number]

## 2010-10-27 NOTE — Discharge Summary (Signed)
   NAME:  Judy Harrison, Judy Harrison                             ACCOUNT NO.:  [id number]   MEDICAL RECORD NO.:  [id number]                   PATIENT TYPE:  INP   LOCATION:  A419                                 FACILITY:  [facility]   PHYSICIAN:  Tilda Burrow, M.D.              DATE OF BIRTH:  1965-12-22   DATE OF ADMISSION:  02/01/2003  DATE OF DISCHARGE:  02/03/2003                                 DISCHARGE SUMMARY   ADMISSION DIAGNOSES:  Pregnancy, 39 weeks, elective sterilization, prior  Cesarean section, not for trial of labor.  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   DISCHARGE DIAGNOSES:  1. Pregnancy, 39 weeks, delivered.  2. Elective sterilization.  3. Obesity with panniculus.   PROCEDURE:  February 01, 2003, repeat low transverse Cesarean section,  bilateral partial salpingectomy, panniculectomy-open (wide excision of  cicatrix).   DISCHARGE MEDICATIONS:  1. Tylox one to two q.4h. p.r.n. pain.  2. Jackson-Pratt drain to be left in for one week.   HOSPITAL SUMMARY:  This 45 year old female, gravida 3, para 2, AB 0, prior  Cesarean section x2 was admitted for a repeat Cesarean section and tubal  ligation with wide excision of old cicatrix. Prenatal course was complicated  by borderline gestational diabetes, diet controlled. Surgical history  positive for Cesarean section x2 and allergies not known.   PHYSICAL EXAMINATION ON ADMISSION:  VITAL SIGNS:  Height 5' 2, weight 199,  blood pressure 134/93, pulse 119, respirations 22, temperature 99.2.  PERTINENT GYNECOLOGICAL EXAM:  Includes a term-sized fetus, vertex  presentation, estimated fetal weight 8 pounds, cervix closed and high.   The patient underwent Cesarean section on August 23, delivering a quite  large 10-pound female, Apgars 9 and 9. Postoperatively, the patient had  hemoglobin 10.1, hematocrit 29.8, considered satisfactory and favorable in  comparison to preop hemoglobin of 11.2, hematocrit 33.2. Maternal blood  type  was reconfirmed as O positive. The patient had minimal output from the JP  drain on the day of discharge, February 03, 2003 postoperative day #2, at  which time she was stable for discharge for followup one week to remove  staples and Jackson-Pratt drain.                                               Tilda Burrow, M.D.   JVF/MEDQ  D:  02/15/2003  T:  02/15/2003  Job:  [id number]

## 2010-10-27 NOTE — Op Note (Signed)
NAME:  Judy Harrison, Judy Harrison                             ACCOUNT NO.:  [id number]   MEDICAL RECORD NO.:  [id number]                   PATIENT TYPE:  INP   LOCATION:  A419                                 FACILITY:  [facility]   PHYSICIAN:  Tilda Burrow, M.D.              DATE OF BIRTH:  08-19-1965   DATE OF PROCEDURE:  DATE OF DISCHARGE:                                 OPERATIVE REPORT   PREOPERATIVE DIAGNOSES:  1. Pregnancy 39 weeks, prior cesarean section x2 not for trial of labor.  2. Desire for elective permanent sterilization.  3. Request for wide excision of cicatrix.   POSTOPERATIVE DIAGNOSES:  1. Pregnancy 39 weeks, prior cesarean section x2 not for trial of labor.  2. Desire for elective permanent sterilization.  3. Request for wide excision of cicatrix.   PROCEDURE:  1. Repeat low transverse cervical cesarean section.  2. Bilateral partial salpingectomy.  3. Wide excision of cicatrix with placement of Jackson-Pratt catheter.   SURGEON:  Tilda Burrow, M.D.   ASSISTANTEarlene Plater, RN, CN in OR   ANESTHESIA:  Spinal x2 -- Nelda Severe   COMPLICATIONS:  Inadequate analgesic level of T12 with first spinal  necessitating a repeat dosing to get analgesic on the right side.   FINDINGS:  A 10 pound female infant, Apgars of 9 and 9, very thin lower  uterine segment less than 2 mm thickness.   DETAILS OF PROCEDURE:  The patient was taken to the operating room and  prepped and draped for lower abdominal surgery with 2 spinals required to  get adequate relief.  The first one gave [place] T12 level on the left, but pain  perceived at the level of the planned surgical incision on the right  requiring repeat spinal.  After the second draping, a Pfannenstiel-type  incision was performed with excision of an approximately 6 cm wide x 30 cm  long ellipse of skin underlying fatty tissues then entry of the fascia in  the midline with easy entry of the peritoneal cavity in the midline,  development of  bladder flap without difficulty and no suspicion of bladder  injury.   The lower uterine segment was exceedingly thin and translucent, and was  opened without difficulty being approximately 2 mm thick.  The fetal vertex  was guided through the incision. Amniotic fluid was clear without malodor.  The infant was delivered without difficult using fundal pressure, not  requiring vacuum extractor guidance.  There was a loose nuchal cord x1.  The  cord was clamped  The infant was placed in the care of Drs. Francoise Schaumann. Halm  and Nicoletta Ba and then the cord blood samples obtained and placenta  delivered intact, Schultz presentation and membranes intact.  Antibiotic  irrigation of the uterine incision was performed followed by single layer of  running locking closure of the thin lower uterine segment opening.  The  bladder flap was reapproximated using 2-0 chromic and hemostasis was good.   TUBAL LIGATION:  Tubal ligation was performed by identifying the midsegment  knuckle of each tube doubly ligating around the midportion of tube and  excising the incarcerated specimen.  A stitch was placed in the right tube  for identification.  Both were sent together to pathology.   Anterior peritoneum was closed using continuous running 2-0 chromic.  The  abdomen had been previously irrigated with antibiotic solution as had been  the uterus.   The fascia was trimmed removing approximately a 2 cm wide strip of the  fascia to allow for more effective closure of the fascia, then the inferior  aspects of the incision revised to release any scarring from prior C-  sections and then the skin edges pulled together with interrupted 2-0 plain  sutures x6.  The subcu flat JP drain was placed just above the fascia and  allowed to exit through the left lower quadrant through a separate stab  incision.  There was a suture required to complete hemostasis near the drain  site. Point cautery was used, as necessary, to  achieve acceptable  hemostasis.  Then, the staples used to close the skin with good tissue  reapproximation.  The patient tolerated the procedure well and went to  recovery room in good condition.  She was genuinely grateful and verbally  appreciative with everybody's efforts and did well.  Went to recovery room  in good condition.                                               Tilda Burrow, M.D.    JVF/MEDQ  D:  02/01/2003  T:  02/01/2003  Job:  [id number]   cc:   Francoise Schaumann. Halm, D.O.  [address]  [place]  [place] [address]  Fax: [phone]

## 2011-03-01 LAB — CBC
HCT: 37.8
Hemoglobin: 13.1
MCHC: 34.6
MCV: 82.5
Platelets: 248
RBC: 4.58
RDW: 15
WBC: 7.1

## 2011-03-01 LAB — HCG, QUANTITATIVE, PREGNANCY

## 2011-03-13 LAB — CBC
HCT: 34.8 — ABNORMAL LOW
HCT: 39.5
Hemoglobin: 11.9 — ABNORMAL LOW
Hemoglobin: 10 — ABNORMAL LOW
MCHC: 34.3
MCHC: 33.8
MCHC: 34.4
MCV: 85.4
Platelets: 103 — ABNORMAL LOW
Platelets: 223
RBC: 4.07
RBC: 3.23 — ABNORMAL LOW
RDW: 14.3
RDW: 13.7
RDW: 14.7
WBC: 2.5 — ABNORMAL LOW
WBC: 6.5

## 2011-03-13 LAB — DIFFERENTIAL
Basophils Absolute: 0
Eosinophils Absolute: 0
Lymphocytes Relative: 11 — ABNORMAL LOW
Lymphs Abs: 0.3 — ABNORMAL LOW
Neutrophils Relative %: 84 — ABNORMAL HIGH

## 2011-03-13 LAB — GLUCOSE, CAPILLARY
Glucose-Capillary: 103 — ABNORMAL HIGH
Glucose-Capillary: 119 — ABNORMAL HIGH
Glucose-Capillary: 125 — ABNORMAL HIGH
Glucose-Capillary: 128 — ABNORMAL HIGH
Glucose-Capillary: 97
Glucose-Capillary: 98

## 2011-03-13 LAB — URINALYSIS, ROUTINE W REFLEX MICROSCOPIC
Bilirubin Urine: NEGATIVE
Glucose, UA: NEGATIVE
Glucose, UA: NEGATIVE
Ketones, ur: >80 — AB
Ketones, ur: NEGATIVE
Nitrite: POSITIVE — AB
Protein, ur: >300 — AB
Protein, ur: 30 — AB
Specific Gravity, Urine: >1.03 — ABNORMAL HIGH
Urobilinogen, UA: 1
pH: 6

## 2011-03-13 LAB — BASIC METABOLIC PANEL
BUN: 5 — ABNORMAL LOW
CO2: 23
CO2: 29
Calcium: 8 — ABNORMAL LOW
Calcium: 8.4
Chloride: 106
Chloride: 106
Creatinine, Ser: 0.72
Creatinine, Ser: 0.83
Creatinine, Ser: 1.03
GFR calc Af Amer: >60
GFR calc non Af Amer: >60
GFR calc non Af Amer: >60
Glucose, Bld: 114 — ABNORMAL HIGH
Glucose, Bld: 177 — ABNORMAL HIGH
Potassium: 3.2 — ABNORMAL LOW
Potassium: 3.7
Sodium: 138

## 2011-03-13 LAB — BLOOD GAS, ARTERIAL
Acid-base deficit: 4.3 — ABNORMAL HIGH
Bicarbonate: 18.9 — ABNORMAL LOW
FIO2: 0.21
O2 Saturation: 87.8
Patient temperature: 37
TCO2: 15.9
pCO2 arterial: 26.8 — ABNORMAL LOW
pH, Arterial: 7.462 — ABNORMAL HIGH
pO2, Arterial: 49.8 — ABNORMAL LOW

## 2011-03-13 LAB — URINE CULTURE: Colony Count: >=100000

## 2011-03-13 LAB — POCT PREGNANCY, URINE: Preg Test, Ur: NEGATIVE

## 2011-03-13 LAB — COMPREHENSIVE METABOLIC PANEL
Albumin: 4
Alkaline Phosphatase: 67
BUN: 12
CO2: 20
Calcium: 8.8
Calcium: 9.6
Creatinine, Ser: 1.28 — ABNORMAL HIGH
GFR calc non Af Amer: 46 — ABNORMAL LOW
Glucose, Bld: 174 — ABNORMAL HIGH
Potassium: 4
Total Protein: 6.5

## 2011-03-13 LAB — URINE MICROSCOPIC-ADD ON

## 2011-03-13 LAB — PROTIME-INR
INR: 1.1
Prothrombin Time: 14.9

## 2011-03-13 LAB — LIPASE, BLOOD: Lipase: 21

## 2011-03-13 LAB — LEGIONELLA ANTIGEN, URINE: Legionella Antigen, Urine: NEGATIVE

## 2011-03-13 LAB — CULTURE, BLOOD (ROUTINE X 2): Report Status: 11092009

## 2011-03-13 LAB — APTT: aPTT: 38 — ABNORMAL HIGH

## 2011-03-13 LAB — LACTIC ACID, PLASMA
Lactic Acid, Venous: 3.3 — ABNORMAL HIGH
Lactic Acid, Venous: 0.9
Lactic Acid, Venous: 2.7 — ABNORMAL HIGH

## 2011-03-13 LAB — PHOSPHORUS: Phosphorus: 2.1 — ABNORMAL LOW

## 2011-03-13 LAB — MAGNESIUM: Magnesium: 1.6

## 2011-03-16 LAB — HEMOGLOBIN AND HEMATOCRIT, BLOOD: Hemoglobin: 12.2 g/dL (ref 12.0–15.0)

## 2011-03-24 ENCOUNTER — Inpatient Hospital Stay (INDEPENDENT_AMBULATORY_CARE_PROVIDER_SITE_OTHER)
Admission: RE | Admit: 2011-03-24 | Discharge: 2011-03-24 | Disposition: A | Discharge status: Home / Self Care | Payer: 59 | Source: Ambulatory Visit | Attending: Family Medicine | Admitting: Family Medicine

## 2011-03-24 DIAGNOSIS — J40 Bronchitis, not specified as acute or chronic: Secondary | ICD-10-CM

## 2011-03-24 DIAGNOSIS — J309 Allergic rhinitis, unspecified: Secondary | ICD-10-CM

## 2011-11-15 ENCOUNTER — Other Ambulatory Visit: Payer: Self-pay

## 2011-11-15 ENCOUNTER — Telehealth: Payer: Self-pay | Admitting: *Deleted

## 2011-11-15 MED ORDER — METOPROLOL TARTRATE 25 MG PO TABS: 25.0000 mg | ORAL_TABLET | Freq: Two times a day (BID) | ORAL | Status: DC

## 2011-11-15 NOTE — Telephone Encounter (Signed)
NEEDS REFILL ON METOPROLOL 25MG  2 PO BID SENT TO [facility]

## 2011-12-20 ENCOUNTER — Emergency Department (HOSPITAL_COMMUNITY)
Admission: EM | Admit: 2011-12-20 | Discharge: 2011-12-20 | Disposition: A | Discharge status: Home / Self Care | Payer: 59 | Attending: Emergency Medicine | Admitting: Emergency Medicine

## 2011-12-20 ENCOUNTER — Encounter (HOSPITAL_COMMUNITY): Payer: Self-pay

## 2011-12-20 DIAGNOSIS — I10 Essential (primary) hypertension: Secondary | ICD-10-CM | POA: Insufficient documentation

## 2011-12-20 DIAGNOSIS — T7840XA Allergy, unspecified, initial encounter: Secondary | ICD-10-CM | POA: Insufficient documentation

## 2011-12-20 HISTORY — DX: Supraventricular tachycardia: I47.1

## 2011-12-20 HISTORY — DX: Supraventricular tachycardia, unspecified: I47.10

## 2011-12-20 HISTORY — DX: Essential (primary) hypertension: I10

## 2011-12-20 HISTORY — DX: Calculus of kidney: N20.0

## 2011-12-20 MED ORDER — FAMOTIDINE IN NACL 20-0.9 MG/50ML-% IV SOLN
INTRAVENOUS | Status: AC
  Administered 2011-12-20: 20 mg via INTRAVENOUS
  Filled 2011-12-20: qty 50

## 2011-12-20 MED ORDER — ONDANSETRON HCL 4 MG/2ML IJ SOLN: 4.0000 mg | Freq: Once | INTRAMUSCULAR | Status: DC

## 2011-12-20 MED ORDER — DIPHENHYDRAMINE HCL 50 MG/ML IJ SOLN
25.0000 mg | Freq: Once | INTRAMUSCULAR | Status: AC
  Administered 2011-12-20: 25 mg via INTRAVENOUS

## 2011-12-20 MED ORDER — METHYLPREDNISOLONE SODIUM SUCC 125 MG IJ SOLR
INTRAMUSCULAR | Status: AC
  Filled 2011-12-20: qty 2

## 2011-12-20 MED ORDER — DIPHENHYDRAMINE HCL 50 MG/ML IJ SOLN
INTRAMUSCULAR | Status: AC
  Filled 2011-12-20: qty 1

## 2011-12-20 MED ORDER — FAMOTIDINE IN NACL 20-0.9 MG/50ML-% IV SOLN
20.0000 mg | Freq: Once | INTRAVENOUS | Status: AC
  Administered 2011-12-20: 20 mg via INTRAVENOUS

## 2011-12-20 MED ORDER — DIPHENHYDRAMINE HCL 50 MG/ML IJ SOLN
INTRAMUSCULAR | Status: AC
  Administered 2011-12-20: 25 mg
  Filled 2011-12-20: qty 1

## 2011-12-20 MED ORDER — METHYLPREDNISOLONE SODIUM SUCC 125 MG IJ SOLR
125.0000 mg | Freq: Once | INTRAMUSCULAR | Status: AC
  Administered 2011-12-20: 125 mg via INTRAVENOUS

## 2011-12-20 MED ORDER — ONDANSETRON HCL 4 MG/2ML IJ SOLN
INTRAMUSCULAR | Status: AC
  Administered 2011-12-20: 4 mg
  Filled 2011-12-20: qty 2

## 2011-12-20 NOTE — ED Notes (Signed)
Pt broke out in whelts approx 30 mins ago, hives and slight wheezing with breathing.

## 2011-12-20 NOTE — ED Provider Notes (Signed)
History     CSN: [id number]  Arrival date & time 12/20/11  0053   First MD Initiated Contact with Patient 12/20/11 0105      Chief Complaint  Patient presents with  . Allergic Reaction    (Consider location/radiation/quality/duration/timing/severity/associated sxs/prior treatment) HPI  Judy Harrison is a 46 y.o. female who presents to the Emergency Department complaining of allergic reaction with itching, hives, hoarse voice, dry cough, ingling of lips. She denies shortness of breath. She states she ate a hamburger around 1930 last night and may have gotten a stray onion. 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 She took 25 mg benadryl prior to arrival.  PCP Dr. Gerda Diss  No past medical history on file.  No past surgical history on file.  No family history on file.  History  Substance Use Topics  . Smoking status: Not on file  . Smokeless tobacco: Not on file  . Alcohol Use: Not on file    OB History    No data available      Review of Systems  Constitutional: Negative for fever.       10 Systems reviewed and are negative for acute change except as noted in the HPI.  HENT: Negative for congestion.        Hoarse voice  Eyes: Negative for discharge and redness.  Respiratory: Positive for cough. Negative for shortness of breath.   Cardiovascular: Negative for chest pain.  Gastrointestinal: Negative for vomiting and abdominal pain.  Musculoskeletal: Negative for back pain.  Skin: Positive for rash.       itching  Neurological: Negative for syncope, numbness and headaches.  Psychiatric/Behavioral:       No behavior change.    Allergies  Onion and Sulfonamide derivatives  Home Medications   Current Outpatient Rx  Name Route Sig Dispense Refill  . METOPROLOL TARTRATE 25 MG PO TABS Oral Take 1 tablet (25 mg total) by mouth 2 (two) times daily. 60 tablet 6    BP 189/110  Pulse 103  Temp 98 F (36.7 C)  (Oral)  Resp 20  Ht 5\' 2"  (1.575 m)  Wt 170 lb (77.111 kg)  BMI 31.09 kg/m2  SpO2 97%  Physical Exam  Nursing note and vitals reviewed. Constitutional: She is oriented to person, place, and time. She appears well-developed and well-nourished.       Awake, alert, nontoxic appearance.  HENT:  Head: Atraumatic.  Right Ear: External ear normal.  Left Ear: External ear normal.  Nose: Nose normal.  Mouth/Throat: Oropharynx is clear and moist.  Eyes: Conjunctivae and EOM are normal. Pupils are equal, round, and reactive to light. Right eye exhibits no discharge. Left eye exhibits no discharge.  Neck: Neck supple.       No stridor  Cardiovascular: Normal heart sounds.   Pulmonary/Chest: Effort normal. She has no wheezes. She has no rales. She exhibits no tenderness.  Abdominal: Soft. There is no tenderness. There is no rebound.  Musculoskeletal: She exhibits no tenderness.       Baseline ROM, no obvious new focal weakness.  Neurological: She is alert and oriented to person, place, and time.       Mental status and motor strength appears baseline for patient and situation.  Skin: No rash noted.       Hives to antecubital spaces, forearms. Hands are erythematous and swollen  Psychiatric: She has a normal mood and affect.  ED Course  Procedures (including critical care time)  0130 Feeling better. Hives are receding. Itching has improved. 0230 Eating ice chips. Still with some itching. Additional benadryl ordered. MDM  Patient presents with non specific allergic reaction to unknown substance.Given solumedrol, pepcid, zofran, benadryl with improvement. Patient has Rx at home for Epi pen.  Pt feels improved after observation and/or treatment in ED.Pt stable in ED with no significant deterioration in condition.The patient appears reasonably screened and/or stabilized for discharge and I doubt any other medical condition or other [facility] requiring further screening, evaluation, or treatment in  the ED at this time prior to discharge.  MDM Reviewed: nursing note and vitals           Nicoletta Dress. Colon Branch, MD 12/20/11 6578

## 2011-12-20 NOTE — ED Notes (Signed)
Patient medicated for nausea per md order

## 2011-12-20 NOTE — ED Notes (Signed)
Patient given ice chips. 

## 2012-09-03 ENCOUNTER — Other Ambulatory Visit: Payer: Self-pay | Admitting: *Deleted

## 2012-09-03 MED ORDER — METOPROLOL TARTRATE 25 MG PO TABS: 25.0000 mg | ORAL_TABLET | Freq: Two times a day (BID) | ORAL | Status: DC

## 2012-11-05 ENCOUNTER — Ambulatory Visit (INDEPENDENT_AMBULATORY_CARE_PROVIDER_SITE_OTHER): Payer: 59 | Admitting: Family Medicine

## 2012-11-05 ENCOUNTER — Encounter: Payer: Self-pay | Admitting: Family Medicine

## 2012-11-05 VITALS — BP 128/80 | Temp 98.8°F | Ht 62.0 in | Wt 174.1 lb

## 2012-11-05 DIAGNOSIS — L259 Unspecified contact dermatitis, unspecified cause: Secondary | ICD-10-CM

## 2012-11-05 MED ORDER — METHYLPREDNISOLONE ACETATE 40 MG/ML IJ SUSP
40.0000 mg | Freq: Once | INTRAMUSCULAR | Status: AC
  Administered 2012-11-05: 40 mg via INTRAMUSCULAR

## 2012-11-05 MED ORDER — EPINEPHRINE 0.3 MG/0.3ML IJ SOAJ: 0.3000 mg | Freq: Once | INTRAMUSCULAR | Status: DC

## 2012-11-05 MED ORDER — PREDNISONE 20 MG PO TABS: ORAL_TABLET | ORAL | Status: AC

## 2012-11-05 MED ORDER — HYDROXYZINE HCL 25 MG PO TABS: 25.0000 mg | ORAL_TABLET | Freq: Three times a day (TID) | ORAL | Status: DC | PRN

## 2012-11-05 NOTE — Progress Notes (Signed)
  Subjective:    Patient ID: Judy Harrison, female    DOB: 01/30/1966, 47 y.o.   MRN: [id number]  Rash This is a new problem. The problem has been gradually worsening since onset. Pain location: all over body. The rash is characterized by itchiness and redness. She was exposed to an insect bite/sting. Past treatments include nothing. The treatment provided no relief.   Pt did use sunscreen on Monday PMH benign no new meds/soap  Review of Systems  Skin: Positive for rash.       Objective:   Physical Exam  Vitals reviewed. Constitutional: She appears well-developed.  HENT:  Head: Normocephalic.  Mouth/Throat: Oropharynx is clear and moist.  Skin: Skin is warm and dry. Rash (consistant with contact derm) noted. She is not diaphoretic.          Assessment & Plan:  Contact dermatitis- atarax, pred, depomedrol If freq attacks then allergist

## 2012-11-25 ENCOUNTER — Telehealth: Payer: Self-pay | Admitting: Family Medicine

## 2012-11-25 MED ORDER — TRIAMCINOLONE ACETONIDE 0.1 % EX CREA: TOPICAL_CREAM | Freq: Two times a day (BID) | CUTANEOUS | Status: DC

## 2012-11-25 NOTE — Telephone Encounter (Signed)
triamcin .1 60 g bid 2 ref

## 2012-11-25 NOTE — Telephone Encounter (Addendum)
Med sent electronically to [facility]. Patient notified. 

## 2012-11-25 NOTE — Telephone Encounter (Signed)
Triamcinolone cr .1 per cent bid 60 g two ref

## 2012-11-25 NOTE — Telephone Encounter (Signed)
Patient says that her dogs have given her poison ivy and she is having a spotty outbreak and Judy Harrison told her that if she ever needed anymore of the cream that she prescribes(pt cant remember name) then she will gladly call her in some to [facility]

## 2012-11-25 NOTE — Telephone Encounter (Signed)
error 

## 2012-11-26 ENCOUNTER — Other Ambulatory Visit: Payer: Self-pay | Admitting: *Deleted

## 2012-11-26 MED ORDER — METOPROLOL TARTRATE 25 MG PO TABS: 25.0000 mg | ORAL_TABLET | Freq: Two times a day (BID) | ORAL | Status: DC

## 2012-11-27 ENCOUNTER — Encounter: Payer: Self-pay | Admitting: *Deleted

## 2012-12-16 ENCOUNTER — Encounter: Payer: Self-pay | Admitting: Family Medicine

## 2012-12-16 DIAGNOSIS — R21 Rash and other nonspecific skin eruption: Secondary | ICD-10-CM

## 2012-12-17 NOTE — Telephone Encounter (Signed)
Recently seen, ref appropriate

## 2013-02-20 ENCOUNTER — Encounter: Payer: Self-pay | Admitting: Cardiology

## 2013-02-20 ENCOUNTER — Ambulatory Visit (INDEPENDENT_AMBULATORY_CARE_PROVIDER_SITE_OTHER): Payer: 59 | Admitting: Cardiology

## 2013-02-20 VITALS — BP 120/70 | HR 56 | Ht 62.0 in | Wt 178.4 lb

## 2013-02-20 DIAGNOSIS — R002 Palpitations: Secondary | ICD-10-CM

## 2013-02-20 DIAGNOSIS — E782 Mixed hyperlipidemia: Secondary | ICD-10-CM

## 2013-02-20 DIAGNOSIS — I498 Other specified cardiac arrhythmias: Secondary | ICD-10-CM

## 2013-02-20 MED ORDER — METOPROLOL TARTRATE 25 MG PO TABS: 25.0000 mg | ORAL_TABLET | Freq: Two times a day (BID) | ORAL | Status: DC

## 2013-02-20 NOTE — Patient Instructions (Addendum)
Your physician recommends that you schedule a follow-up appointment in: 6 MONTHS   Your physician recommends that you return for lab work in: THIS WEEK FOR FASTING LIPIDS (WE WILL CALL YOU WITH THE SLIPS)  Your physician recommends THAT WE FILL METOPROLOL FOR 11 REFILLS, THIS HAS BEEN SENT TO YOUR PHARMACY

## 2013-02-20 NOTE — Progress Notes (Signed)
Clinical Summary [name] is a 47 y.o.female  1. SVT -Jun 13 2010 episode of unsteady gait and palpitations. Broke w/ valsalva.  - denies caffeine, rarely drinks EtOH.  - previous labs showed K 4., normal TSH - echo ordered - reports no similar episodes. Occas palp for just a few seconds, once or twice a week. Tolerates exertion without significant symptoms, no orthopnea, no PND. Denies any consistent chest pain, occasional heaviness associated w/ stress, resolves w/ taking a deep breath and relaxing.   2. HTN - checks bp at work once a week, typically runs 130s/80s.One episode of HA and high bp 190/110 w/ HA at work, resolved w/ extra metoprolol and resolution of symptoms. Reports some occasional high bps when she checks them at work, most often in the setting of high stress levels.    3. Weight gain: endorses poor eating habits associated w/ increased stress, reports 12 lbs weight gain.   Past Medical History  Diagnosis Date  . Hypertension   . Supraventricular tachycardia   . Kidney stone   . Anaphylaxis     Onions     Allergies  Allergen Reactions  . Onion   . Sulfonamide Derivatives     REACTION: hives, swelling, rash     Current Outpatient Prescriptions  Medication Sig Dispense Refill  . aspirin 81 MG tablet Take 81 mg by mouth daily.      Marland Kitchen EPINEPHrine (EPI-PEN) 0.3 mg/0.3 mL DEVI Inject 0.3 mLs (0.3 mg total) into the muscle once.  2 Device  4  . hydrOXYzine (ATARAX/VISTARIL) 25 MG tablet Take 1 tablet (25 mg total) by mouth 3 (three) times daily as needed for itching.  60 tablet  0  . ibuprofen (ADVIL,MOTRIN) 400 MG tablet Take 400 mg by mouth every 6 (six) hours as needed.      . metoprolol tartrate (LOPRESSOR) 25 MG tablet Take 1 tablet (25 mg total) by mouth 2 (two) times daily.  180 tablet  1  . triamcinolone cream (KENALOG) 0.1 % Apply topically 2 (two) times daily.  30 g  2   No current facility-administered medications for this visit.     Past  Surgical History  Procedure Laterality Date  . Tubal ligation    . Uterine abla    . Cesarean section       Allergies  Allergen Reactions  . Onion   . Sulfonamide Derivatives     REACTION: hives, swelling, rash      Family History  Problem Relation Age of Onset  . Cancer Mother   . Hypertension Mother      Social History [name] reports that she has never smoked. She does not have any smokeless tobacco history on file. [name] reports that  drinks alcohol.   Review of Systems   Physical Examination p 56 bp 120/70 Gen: NAD CV: RRR, no m/r/g, no JVD Pulm: CTAB GNF:AOZH, NT, ND Ext: warm, no edema Neuro: A&Ox3, no focal deficits Ext: warm, no edema     Diagnostic Studies   EKG: 02/20/13: NSR, normal axis, PR , QRS .08 ms, no ischemic changes   Assessment and Plan  1. Palpitations: limited symptoms, seems to be controlled on the current dose of metoprolol. Continue current therapy. Likely SVT based on history broke w/ valsalva, however never confirmed electrocardiographically. EKG shows sinus rhythm, no evidence of preexcitation.   2. HTN: reports some occasional elevated bps at work, lots of stress related to work currently so I feel these  aren't accurate of her true bp and more indicative of her bodies normal response to stress. Her bp in clinic is at goal, recommend checking bp at home under more relaxing conditions to get more accurate data. - continue current bp meds  3. Obesity: patient encouraged to make dietary changes, increase exercise activity.   Antoine Poche, M.D., F.A.C.C.

## 2013-03-11 ENCOUNTER — Telehealth: Payer: Self-pay | Admitting: Nurse Practitioner

## 2013-03-11 NOTE — Telephone Encounter (Signed)
error 

## 2013-04-16 ENCOUNTER — Other Ambulatory Visit: Payer: Self-pay

## 2013-05-02 ENCOUNTER — Other Ambulatory Visit: Payer: Self-pay | Admitting: Family Medicine

## 2013-05-04 ENCOUNTER — Other Ambulatory Visit: Payer: Self-pay | Admitting: Nurse Practitioner

## 2013-05-04 MED ORDER — TRIAMCINOLONE ACETONIDE 0.1 % EX CREA: TOPICAL_CREAM | CUTANEOUS | Status: DC

## 2013-06-02 ENCOUNTER — Other Ambulatory Visit: Payer: Self-pay | Admitting: Cardiovascular Disease

## 2013-06-02 MED ORDER — AZITHROMYCIN 250 MG PO TABS: ORAL_TABLET | ORAL | Status: DC

## 2013-06-02 MED ORDER — ALBUTEROL SULFATE HFA 108 (90 BASE) MCG/ACT IN AERS: 2.0000 | INHALATION_SPRAY | Freq: Four times a day (QID) | RESPIRATORY_TRACT | Status: DC | PRN

## 2013-06-02 NOTE — Progress Notes (Unsigned)
Jazmarie cough and wheezing.   Yellow / greens sputum.  No fever.  Will call in Z-pack and Proair.   Vesta Mixer, Montez Hageman., MD, [facility] 06/02/2013, 3:32 PM Office - [phone] Pager [phone]

## 2013-09-03 ENCOUNTER — Telehealth: Payer: Self-pay | Admitting: Family Medicine

## 2013-09-03 NOTE — Telephone Encounter (Signed)
Mom calling to see if FMLA is ready on her daughter Judy Harrison that she left with you last week she states.

## 2013-09-03 NOTE — Telephone Encounter (Signed)
I have not seen FMLA for [place]. Not on my desk.

## 2014-02-05 ENCOUNTER — Other Ambulatory Visit: Payer: Self-pay | Admitting: Adult Health

## 2014-02-05 DIAGNOSIS — N63 Unspecified lump in unspecified breast: Secondary | ICD-10-CM

## 2014-02-08 ENCOUNTER — Ambulatory Visit (INDEPENDENT_AMBULATORY_CARE_PROVIDER_SITE_OTHER): Payer: 59 | Admitting: Adult Health

## 2014-02-08 ENCOUNTER — Encounter: Payer: Self-pay | Admitting: Adult Health

## 2014-02-08 ENCOUNTER — Other Ambulatory Visit (HOSPITAL_COMMUNITY)
Admission: RE | Admit: 2014-02-08 | Discharge: 2014-02-08 | Disposition: A | Discharge status: Home / Self Care | Payer: 59 | Source: Ambulatory Visit | Attending: Adult Health | Admitting: Adult Health

## 2014-02-08 VITALS — BP 130/90 | HR 76 | Ht 62.0 in | Wt 178.0 lb

## 2014-02-08 DIAGNOSIS — Z1151 Encounter for screening for human papillomavirus (HPV): Secondary | ICD-10-CM | POA: Diagnosis present

## 2014-02-08 DIAGNOSIS — N951 Menopausal and female climacteric states: Secondary | ICD-10-CM

## 2014-02-08 DIAGNOSIS — R350 Frequency of micturition: Secondary | ICD-10-CM

## 2014-02-08 DIAGNOSIS — Z01419 Encounter for gynecological examination (general) (routine) without abnormal findings: Secondary | ICD-10-CM | POA: Diagnosis present

## 2014-02-08 DIAGNOSIS — L720 Epidermal cyst: Secondary | ICD-10-CM

## 2014-02-08 DIAGNOSIS — N390 Urinary tract infection, site not specified: Secondary | ICD-10-CM

## 2014-02-08 DIAGNOSIS — Z1212 Encounter for screening for malignant neoplasm of rectum: Secondary | ICD-10-CM

## 2014-02-08 HISTORY — DX: Epidermal cyst: L72.0

## 2014-02-08 HISTORY — DX: Menopausal and female climacteric states: N95.1

## 2014-02-08 HISTORY — DX: Urinary tract infection, site not specified: N39.0

## 2014-02-08 LAB — HEMOCCULT GUIAC POC 1CARD (OFFICE): FECAL OCCULT BLD: NEGATIVE

## 2014-02-08 LAB — POCT URINALYSIS DIPSTICK
Blood, UA: NEGATIVE
GLUCOSE UA: NEGATIVE
LEUKOCYTES UA: NEGATIVE
Nitrite, UA: POSITIVE
Protein, UA: NEGATIVE

## 2014-02-08 MED ORDER — NITROFURANTOIN MONOHYD MACRO 100 MG PO CAPS: 100.0000 mg | ORAL_CAPSULE | Freq: Two times a day (BID) | ORAL | Status: DC

## 2014-02-08 NOTE — Patient Instructions (Signed)
Perimenopause Perimenopause is the time when your body begins to move into the menopause (no menstrual period for 12 straight months). It is a natural process. Perimenopause can begin 2-8 years before the menopause and usually lasts for 1 year after the menopause. During this time, your ovaries may or may not produce an egg. The ovaries vary in their production of estrogen and progesterone hormones each month. This can cause irregular menstrual periods, difficulty getting pregnant, vaginal bleeding between periods, and uncomfortable symptoms. CAUSES  Irregular production of the ovarian hormones, estrogen and progesterone, and not ovulating every month.  Other causes include:  Tumor of the pituitary gland in the brain.  Medical disease that affects the ovaries.  Radiation treatment.  Chemotherapy.  Unknown causes.  Heavy smoking and excessive alcohol intake can bring on perimenopause sooner. SIGNS AND SYMPTOMS   Hot flashes.  Night sweats.  Irregular menstrual periods.  Decreased sex drive.  Vaginal dryness.  Headaches.  Mood swings.  Depression.  Memory problems.  Irritability.  Tiredness.  Weight gain.  Trouble getting pregnant.  The beginning of losing bone cells (osteoporosis).  The beginning of hardening of the arteries (atherosclerosis). DIAGNOSIS  Your health care provider will make a diagnosis by analyzing your age, menstrual history, and symptoms. He or she will do a physical exam and note any changes in your body, especially your female organs. Female hormone tests may or may not be helpful depending on the amount of female hormones you produce and when you produce them. However, other hormone tests may be helpful to rule out other problems. TREATMENT  In some cases, no treatment is needed. The decision on whether treatment is necessary during the perimenopause should be made by you and your health care provider based on how the symptoms are affecting you  and your lifestyle. Various treatments are available, such as:  Treating individual symptoms with a specific medicine for that symptom.  Herbal medicines that can help specific symptoms.  Counseling.  Group therapy. HOME CARE INSTRUCTIONS   Keep track of your menstrual periods (when they occur, how heavy they are, how long between periods, and how long they last) as well as your symptoms and when they started.  Only take over-the-counter or prescription medicines as directed by your health care provider.  Sleep and rest.  Exercise.  Eat a diet that contains calcium (good for your bones) and soy (acts like the estrogen hormone).  Do not smoke.  Avoid alcoholic beverages.  Take vitamin supplements as recommended by your health care provider. Taking vitamin E may help in certain cases.  Take calcium and vitamin D supplements to help prevent bone loss.  Group therapy is sometimes helpful.  Acupuncture may help in some cases. SEEK MEDICAL CARE IF:   You have questions about any symptoms you are having.  You need a referral to a specialist (gynecologist, psychiatrist, or psychologist). SEEK IMMEDIATE MEDICAL CARE IF:   You have vaginal bleeding.  Your period lasts longer than 8 days.  Your periods are recurring sooner than 21 days.  You have bleeding after intercourse.  You have severe depression.  You have pain when you urinate.  You have severe headaches.  You have vision problems. Document Released: 07/05/2004 Document Revised: 03/18/2013 Document Reviewed: 12/25/2012 [facility] Patient Information 2015 [place], [place]. This information is not intended to replace advice given to you by your health care provider. Make sure you discuss any questions you have with your health care provider. Urinary Tract Infection Urinary tract  infections (UTIs) can develop anywhere along your urinary tract. Your urinary tract is your body's drainage system for removing wastes and  extra water. Your urinary tract includes two kidneys, two ureters, a bladder, and a urethra. Your kidneys are a pair of bean-shaped organs. Each kidney is about the size of your fist. They are located below your ribs, one on each side of your spine. CAUSES Infections are caused by microbes, which are microscopic organisms, including fungi, viruses, and bacteria. These organisms are so small that they can only be seen through a microscope. Bacteria are the microbes that most commonly cause UTIs. SYMPTOMS  Symptoms of UTIs may vary by age and gender of the patient and by the location of the infection. Symptoms in young women typically include a frequent and intense urge to urinate and a painful, burning feeling in the bladder or urethra during urination. Older women and men are more likely to be tired, shaky, and weak and have muscle aches and abdominal pain. A fever may mean the infection is in your kidneys. Other symptoms of a kidney infection include pain in your back or sides below the ribs, nausea, and vomiting. DIAGNOSIS To diagnose a UTI, your caregiver will ask you about your symptoms. Your caregiver also will ask to provide a urine sample. The urine sample will be tested for bacteria and white blood cells. White blood cells are made by your body to help fight infection. TREATMENT  Typically, UTIs can be treated with medication. Because most UTIs are caused by a bacterial infection, they usually can be treated with the use of antibiotics. The choice of antibiotic and length of treatment depend on your symptoms and the type of bacteria causing your infection. HOME CARE INSTRUCTIONS  If you were prescribed antibiotics, take them exactly as your caregiver instructs you. Finish the medication even if you feel better after you have only taken some of the medication.  Drink enough water and fluids to keep your urine clear or pale yellow.  Avoid caffeine, tea, and carbonated beverages. They tend to  irritate your bladder.  Empty your bladder often. Avoid holding urine for long periods of time.  Empty your bladder before and after sexual intercourse.  After a bowel movement, women should cleanse from front to back. Use each tissue only once. SEEK MEDICAL CARE IF:   You have back pain.  You develop a fever.  Your symptoms do not begin to resolve within 3 days. SEEK IMMEDIATE MEDICAL CARE IF:   You have severe back pain or lower abdominal pain.  You develop chills.  You have nausea or vomiting.  You have continued burning or discomfort with urination. MAKE SURE YOU:   Understand these instructions.  Will watch your condition.  Will get help right away if you are not doing well or get worse. Document Released: 03/07/2005 Document Revised: 11/27/2011 Document Reviewed: 07/06/2011 [facility] Patient Information 2015 [place], [place]. This information is not intended to replace advice given to you by your health care provider. Make sure you discuss any questions you have with your health care provider. Increase fluids Try luvena Get labs Physical in 1 year Mammogram now and yearly colonoscopy at 49 Take macrobid

## 2014-02-08 NOTE — Progress Notes (Signed)
Patient ID: Judy Harrison, female   DOB: 09-25-1965, 48 y.o.   MRN: [id number] History of Present Illness: Judy Harrison is a 48 year old white female, married, in for a pap and physical.She is complaining of urinary frequency and irregular periods and hot flashes and night sweats and sleep disturbance and fatigue.Has increased stress.Sex uncomfortable at times.Has odor some when pees and has lump left breast. She has had ablation.  Current Medications, Allergies, Past Medical History, Past Surgical History, Family History and Social History were reviewed in [employer] record.     Review of Systems: Patient denies any headaches, blurred vision, shortness of breath, chest pain, abdominal pain, problems with bowel movements. No joint pain.See HPI.   Physical Exam:BP 130/90  Pulse 76  Ht  (1.575 m)  Wt 178 lb (80.74 kg)  BMI 32.55 kg/m2  LMP 08/10/2015urine + nitrates. General:  Well developed, well nourished, no acute distress Skin:  Warm and dry Neck:  Midline trachea, normal thyroid Lungs; Clear to auscultation bilaterally Breast:  No dominant palpable mass, retraction, or nipple discharge, has epidermal cyst left breast at about 11 o'clock and some white cheesy material was expressed, and it is smaller. Cardiovascular: Regular rate and rhythm Abdomen:  Soft, non tender, no hepatosplenomegaly Pelvic:  External genitalia has some vitiligo changes. The vagina has loss of color, and rugae and moisture.  The cervix is tiny and smooth.Pap with HPV performed.  Uterus is felt to be normal size, shape, and contour.  No  adnexal masses or tenderness noted. Rectal: Good sphincter tone, no polyps,some hemorrhoids felt.  Hemoccult negative. Extremities:  No swelling or varicosities noted Psych:  No mood changes, alert and cooperative,seems happy Discussed HRT,SSRIs,for menopause, will watch for now, increase foreplay and try astor glide with sex.   Impression: Yearly gyn  exam UTI Epidermal cyst Perimenopausal Urinary frequency    Plan: UA C&S sent Physical in 1 year Mammogram now and yearly Colonoscopy at 49 Check labs fasting,CBC,CMP,TSH and lipids(hard copy given) Rx macrobid 1 bid x 7 days  Push fluids Try luvnea Review handout on peri menopause and UTI

## 2014-02-09 ENCOUNTER — Other Ambulatory Visit: Payer: Self-pay | Admitting: Adult Health

## 2014-02-09 DIAGNOSIS — N63 Unspecified lump in unspecified breast: Secondary | ICD-10-CM

## 2014-02-09 LAB — URINALYSIS
BILIRUBIN URINE: NEGATIVE
GLUCOSE, UA: NEGATIVE mg/dL
Hgb urine dipstick: NEGATIVE
Ketones, ur: NEGATIVE mg/dL
Leukocytes, UA: NEGATIVE
Nitrite: POSITIVE — AB
PH: 6 (ref 5.0–8.0)
Protein, ur: NEGATIVE mg/dL
SPECIFIC GRAVITY, URINE: 1.025 (ref 1.005–1.030)
Urobilinogen, UA: 0.2 mg/dL (ref 0.0–1.0)

## 2014-02-10 ENCOUNTER — Other Ambulatory Visit: Payer: Self-pay

## 2014-02-10 ENCOUNTER — Other Ambulatory Visit: Payer: Self-pay | Admitting: Adult Health

## 2014-02-10 DIAGNOSIS — N63 Unspecified lump in unspecified breast: Secondary | ICD-10-CM

## 2014-02-10 LAB — CYTOLOGY - PAP

## 2014-02-11 LAB — URINE CULTURE: Colony Count: >=100000

## 2014-02-12 ENCOUNTER — Ambulatory Visit
Admission: RE | Admit: 2014-02-12 | Discharge: 2014-02-12 | Disposition: A | Discharge status: Home / Self Care | Payer: 59 | Source: Ambulatory Visit | Attending: Adult Health | Admitting: Adult Health

## 2014-02-12 DIAGNOSIS — N63 Unspecified lump in unspecified breast: Secondary | ICD-10-CM

## 2014-02-16 ENCOUNTER — Telehealth: Payer: Self-pay | Admitting: Adult Health

## 2014-02-16 NOTE — Telephone Encounter (Signed)
Pt feels better, aware of culture and mammograms

## 2014-03-04 ENCOUNTER — Other Ambulatory Visit: Payer: Self-pay | Admitting: Cardiology

## 2014-03-08 ENCOUNTER — Ambulatory Visit (INDEPENDENT_AMBULATORY_CARE_PROVIDER_SITE_OTHER): Payer: 59

## 2014-03-08 DIAGNOSIS — Z23 Encounter for immunization: Secondary | ICD-10-CM

## 2014-03-10 ENCOUNTER — Other Ambulatory Visit (INDEPENDENT_AMBULATORY_CARE_PROVIDER_SITE_OTHER): Payer: 59

## 2014-03-10 ENCOUNTER — Other Ambulatory Visit: Payer: Self-pay

## 2014-03-10 DIAGNOSIS — Z0000 Encounter for general adult medical examination without abnormal findings: Secondary | ICD-10-CM

## 2014-03-11 ENCOUNTER — Telehealth: Payer: Self-pay | Admitting: Adult Health

## 2014-03-11 LAB — LIPID PANEL
CHOLESTEROL: 241 mg/dL — AB (ref 0–200)
HDL: 31.2 mg/dL — ABNORMAL LOW (ref >39.00)
NONHDL: 209.8
Total CHOL/HDL Ratio: 8
VLDL: 122.2 mg/dL — AB (ref 0.0–40.0)

## 2014-03-11 LAB — CBC WITH DIFFERENTIAL/PLATELET
BASOS PCT: 0.3 % (ref 0.0–3.0)
Basophils Absolute: 0 10*3/uL (ref 0.0–0.1)
EOS ABS: 0.2 10*3/uL (ref 0.0–0.7)
Eosinophils Relative: 4 % (ref 0.0–5.0)
HCT: 39.2 % (ref 36.0–46.0)
Hemoglobin: 13.3 g/dL (ref 12.0–15.0)
LYMPHS PCT: 31 % (ref 12.0–46.0)
Lymphs Abs: 1.8 10*3/uL (ref 0.7–4.0)
MCHC: 34 g/dL (ref 30.0–36.0)
MCV: 89.8 fl (ref 78.0–100.0)
MONO ABS: 0.3 10*3/uL (ref 0.1–1.0)
Monocytes Relative: 4.9 % (ref 3.0–12.0)
NEUTROS PCT: 59.8 % (ref 43.0–77.0)
Neutro Abs: 3.6 10*3/uL (ref 1.4–7.7)
PLATELETS: 241 10*3/uL (ref 150.0–400.0)
RBC: 4.36 Mil/uL (ref 3.87–5.11)
RDW: 14.3 % (ref 11.5–15.5)
WBC: 6 10*3/uL (ref 4.0–10.5)

## 2014-03-11 LAB — COMPREHENSIVE METABOLIC PANEL
ALBUMIN: 4.2 g/dL (ref 3.5–5.2)
ALK PHOS: 53 U/L (ref 39–117)
ALT: 31 U/L (ref 0–35)
AST: 27 U/L (ref 0–37)
BUN: 9 mg/dL (ref 6–23)
CO2: 25 mEq/L (ref 19–32)
Calcium: 9.6 mg/dL (ref 8.4–10.5)
Chloride: 105 mEq/L (ref 96–112)
Creatinine, Ser: 0.7 mg/dL (ref 0.4–1.2)
GFR: 97.81 mL/min (ref >60.00)
Glucose, Bld: 100 mg/dL — ABNORMAL HIGH (ref 70–99)
Potassium: 4.3 mEq/L (ref 3.5–5.1)
SODIUM: 136 meq/L (ref 135–145)
Total Bilirubin: 0.7 mg/dL (ref 0.2–1.2)
Total Protein: 7.1 g/dL (ref 6.0–8.3)

## 2014-03-11 LAB — LDL CHOLESTEROL, DIRECT: Direct LDL: 96.8 mg/dL

## 2014-03-11 LAB — TSH: TSH: 1.68 u[IU]/mL (ref 0.35–4.50)

## 2014-03-11 NOTE — Telephone Encounter (Signed)
No answer will call back on Monday

## 2014-03-15 ENCOUNTER — Telehealth: Payer: Self-pay | Admitting: Adult Health

## 2014-03-15 DIAGNOSIS — E782 Mixed hyperlipidemia: Secondary | ICD-10-CM

## 2014-03-15 NOTE — Telephone Encounter (Signed)
Pt aware of labs and triglycerides 611 will repeat, to make sure of result and will be fasting, her dad and brother have elevated labs,

## 2014-03-30 ENCOUNTER — Telehealth: Payer: Self-pay | Admitting: Adult Health

## 2014-03-30 NOTE — Telephone Encounter (Signed)
Left message reminding Judy Harrison to get labs

## 2014-04-09 ENCOUNTER — Other Ambulatory Visit (INDEPENDENT_AMBULATORY_CARE_PROVIDER_SITE_OTHER): Payer: 59 | Admitting: *Deleted

## 2014-04-09 DIAGNOSIS — E785 Hyperlipidemia, unspecified: Secondary | ICD-10-CM

## 2014-04-09 LAB — LIPID PANEL
CHOL/HDL RATIO: 6
Cholesterol: 220 mg/dL — ABNORMAL HIGH (ref 0–200)
HDL: 35.4 mg/dL — ABNORMAL LOW (ref >39.00)
NONHDL: 184.6
Triglycerides: 215 mg/dL — ABNORMAL HIGH (ref 0.0–149.0)
VLDL: 43 mg/dL — ABNORMAL HIGH (ref 0.0–40.0)

## 2014-04-09 LAB — LDL CHOLESTEROL, DIRECT: Direct LDL: 140 mg/dL

## 2014-04-12 ENCOUNTER — Encounter: Payer: Self-pay | Admitting: Adult Health

## 2014-04-13 ENCOUNTER — Telehealth: Payer: Self-pay | Admitting: Adult Health

## 2014-04-13 NOTE — Telephone Encounter (Signed)
Pt aware labs much better will recheck in 1 month and check A1c then too,keep exercising and watching diet

## 2014-06-01 ENCOUNTER — Other Ambulatory Visit: Payer: Self-pay | Admitting: Nurse Practitioner

## 2014-06-01 ENCOUNTER — Telehealth: Payer: Self-pay | Admitting: *Deleted

## 2014-06-01 MED ORDER — PREDNISONE 20 MG PO TABS: ORAL_TABLET | ORAL | Status: DC

## 2014-06-01 NOTE — Telephone Encounter (Signed)
Sent in. Tell her we have done this rodeo before LOL

## 2014-06-01 NOTE — Telephone Encounter (Signed)
Patient notified

## 2014-06-01 NOTE — Telephone Encounter (Signed)
Pt got poison ivy again from her husband in her private area. She said this has happened before. Would like a prednisone taper [facility].

## 2014-09-24 ENCOUNTER — Other Ambulatory Visit: Payer: Self-pay | Admitting: Adult Health

## 2014-09-24 ENCOUNTER — Telehealth: Payer: Self-pay | Admitting: Cardiology

## 2014-09-24 DIAGNOSIS — R921 Mammographic calcification found on diagnostic imaging of breast: Secondary | ICD-10-CM

## 2014-09-24 DIAGNOSIS — N6320 Unspecified lump in the left breast, unspecified quadrant: Secondary | ICD-10-CM

## 2014-09-27 MED ORDER — METOPROLOL TARTRATE 25 MG PO TABS: 25.0000 mg | ORAL_TABLET | Freq: Two times a day (BID) | ORAL | Status: DC

## 2014-09-27 NOTE — Telephone Encounter (Signed)
Refill request complete 

## 2014-09-27 NOTE — Telephone Encounter (Signed)
Received fax refill request  Rx # [id number] Medication:  Metoprolol Tartrate 25mg  Tablets Qty 180 Sig:  Take 1 tablet by mouth twice daily Physician:  Dina RichBranch, Jonathan

## 2014-10-06 ENCOUNTER — Ambulatory Visit
Admission: RE | Admit: 2014-10-06 | Discharge: 2014-10-06 | Disposition: A | Discharge status: Home / Self Care | Payer: 59 | Source: Ambulatory Visit | Attending: Adult Health | Admitting: Adult Health

## 2014-10-06 ENCOUNTER — Other Ambulatory Visit: Payer: Self-pay | Admitting: Adult Health

## 2014-10-06 DIAGNOSIS — N6320 Unspecified lump in the left breast, unspecified quadrant: Secondary | ICD-10-CM

## 2014-10-06 DIAGNOSIS — R921 Mammographic calcification found on diagnostic imaging of breast: Secondary | ICD-10-CM

## 2014-11-09 ENCOUNTER — Ambulatory Visit (INDEPENDENT_AMBULATORY_CARE_PROVIDER_SITE_OTHER): Payer: 59 | Admitting: Cardiology

## 2014-11-09 ENCOUNTER — Encounter: Payer: Self-pay | Admitting: Cardiology

## 2014-11-09 VITALS — BP 130/80 | HR 57 | Ht 62.5 in | Wt 173.0 lb

## 2014-11-09 DIAGNOSIS — I10 Essential (primary) hypertension: Secondary | ICD-10-CM | POA: Diagnosis not present

## 2014-11-09 DIAGNOSIS — Z136 Encounter for screening for cardiovascular disorders: Secondary | ICD-10-CM

## 2014-11-09 DIAGNOSIS — R002 Palpitations: Secondary | ICD-10-CM

## 2014-11-09 NOTE — Progress Notes (Signed)
Clinical Summary Judy Harrison is a 49 y.o.female seen today for follow up of the following medical problems.   1. Palpitations -no recent episodes - limiting caffeine and EtOH -compliant with beta blocker  2. HTN - does not check regularly at home - compliant with meds   Past Medical History  Diagnosis Date  . Hypertension   . Supraventricular tachycardia   . Kidney stone   . Anaphylaxis     Onions  . UTI (lower urinary tract infection) 02/08/2014  . Epidermal cyst 02/08/2014  . Peri-menopausal 02/08/2014     Allergies  Allergen Reactions  . Onion   . Sulfonamide Derivatives     REACTION: hives, swelling, rash     Current Outpatient Prescriptions  Medication Sig Dispense Refill  . aspirin 81 MG tablet Take 81 mg by mouth daily.    . cetirizine (ZYRTEC) 10 MG tablet Take 10 mg by mouth at bedtime.    Marland Kitchen EPINEPHrine (EPI-PEN) 0.3 mg/0.3 mL DEVI Inject 0.3 mLs (0.3 mg total) into the muscle once. 2 Device 4  . hydrOXYzine (ATARAX/VISTARIL) 25 MG tablet Take 25 mg by mouth as needed for itching.    Marland Kitchen ibuprofen (ADVIL,MOTRIN) 800 MG tablet Take 800 mg by mouth as needed.    . metoprolol tartrate (LOPRESSOR) 25 MG tablet Take 1 tablet (25 mg total) by mouth 2 (two) times daily. 180 tablet 3  . nitrofurantoin, macrocrystal-monohydrate, (MACROBID) 100 MG capsule Take 1 capsule (100 mg total) by mouth 2 (two) times daily. 14 capsule 0  . predniSONE (DELTASONE) 20 MG tablet 3 po qd x 3 d then 2 po qd x 3 d then 1 po qd x 3 d 18 tablet 0  . triamcinolone cream (KENALOG) 0.1 % Use BID prn rash up to 2 weeks at a time 45 g 0   No current facility-administered medications for this visit.     Past Surgical History  Procedure Laterality Date  . Tubal ligation    . Uterine abla    . Cesarean section    . Stent in kidney    . Endometrial ablation       Allergies  Allergen Reactions  . Onion   . Sulfonamide Derivatives     REACTION: hives, swelling, rash      Family  History  Problem Relation Age of Onset  . Cancer Mother   . Hypertension Mother   . Hypertension Father   . Hyperlipidemia Father   . Hyperlipidemia Brother   . Anxiety disorder Daughter   . Other Son     lymphedema  . Hypertension Maternal Grandmother   . Other Maternal Grandmother     CVA  . Alzheimer's disease Maternal Grandmother   . Cancer Maternal Grandfather     liver, lung  . Other Paternal Grandmother     CVA  . Alzheimer's disease Paternal Grandmother   . Heart disease Paternal Grandfather   . Heart attack Paternal Grandfather   . Other Son     lung contusion; liver lac     Social History Judy Harrison reports that she has never smoked. She has never used smokeless tobacco. Judy Harrison reports that she drinks alcohol.   Review of Systems CONSTITUTIONAL: No weight loss, fever, chills, weakness or fatigue.  HEENT: Eyes: No visual loss, blurred vision, double vision or yellow sclerae.No hearing loss, sneezing, congestion, runny nose or sore throat.  SKIN: No rash or itching.  CARDIOVASCULAR: no chest pain, no palpitations RESPIRATORY: No  shortness of breath, cough or sputum.  GASTROINTESTINAL: No anorexia, nausea, vomiting or diarrhea. No abdominal pain or blood.  GENITOURINARY: No burning on urination, no polyuria NEUROLOGICAL: No headache, dizziness, syncope, paralysis, ataxia, numbness or tingling in the extremities. No change in bowel or bladder control.  MUSCULOSKELETAL: No muscle, back pain, joint pain or stiffness.  LYMPHATICS: No enlarged nodes. No history of splenectomy.  PSYCHIATRIC: No history of depression or anxiety.  ENDOCRINOLOGIC: No reports of sweating, cold or heat intolerance. No polyuria or polydipsia.  Marland Kitchen.   Physical Examination Filed Vitals:   11/09/14 0856  BP: 130/80  Pulse: 57   Filed Vitals:   11/09/14 0856  Height: 5' 2.5" (1.588 m)  Weight: 173 lb (78.472 kg)     Gen: resting comfortably, no acute distress HEENT: no scleral  icterus, pupils equal round and reactive, no palptable cervical adenopathy,  CV: RRR, no m/r/g, no JVD Resp: Clear to auscultation bilaterally GI: abdomen is soft, non-tender, non-distended, normal bowel sounds, no hepatosplenomegaly MSK: extremities are warm, no edema.  Skin: warm, no rash Neuro:  no focal deficits Psych: appropriate affect   Diagnostic Studies EKG: NSR    Assessment and Plan   1. Palpitations - no recent symptoms, continue lopressor  2. HTN - at goal, continue current meds   F/u 1 year     Antoine PocheJonathan F. Tkeyah Burkman, M.D..

## 2014-11-09 NOTE — Patient Instructions (Signed)
Your physician wants you to follow-up in: 1 year with DrBranch You will receive a reminder letter in the mail two months in advance. If you don't receive a letter, please call our office to schedule the follow-up appointment.     Your physician recommends that you continue on your current medications as directed. Please refer to the Current Medication list given to you today.      Thank you for choosing  [facility] !        

## 2014-11-14 ENCOUNTER — Encounter (HOSPITAL_COMMUNITY): Payer: Self-pay | Admitting: *Deleted

## 2014-11-14 ENCOUNTER — Emergency Department (HOSPITAL_COMMUNITY)
Admission: EM | Admit: 2014-11-14 | Discharge: 2014-11-14 | Disposition: A | Discharge status: Home / Self Care | Payer: 59 | Attending: Emergency Medicine | Admitting: Emergency Medicine

## 2014-11-14 DIAGNOSIS — L299 Pruritus, unspecified: Secondary | ICD-10-CM | POA: Diagnosis present

## 2014-11-14 DIAGNOSIS — T782XXA Anaphylactic shock, unspecified, initial encounter: Secondary | ICD-10-CM

## 2014-11-14 DIAGNOSIS — Y9389 Activity, other specified: Secondary | ICD-10-CM | POA: Diagnosis not present

## 2014-11-14 DIAGNOSIS — Z7982 Long term (current) use of aspirin: Secondary | ICD-10-CM | POA: Insufficient documentation

## 2014-11-14 DIAGNOSIS — Z87440 Personal history of urinary (tract) infections: Secondary | ICD-10-CM | POA: Diagnosis not present

## 2014-11-14 DIAGNOSIS — Y9289 Other specified places as the place of occurrence of the external cause: Secondary | ICD-10-CM | POA: Insufficient documentation

## 2014-11-14 DIAGNOSIS — Z79899 Other long term (current) drug therapy: Secondary | ICD-10-CM | POA: Diagnosis not present

## 2014-11-14 DIAGNOSIS — Z8742 Personal history of other diseases of the female genital tract: Secondary | ICD-10-CM | POA: Diagnosis not present

## 2014-11-14 DIAGNOSIS — Z872 Personal history of diseases of the skin and subcutaneous tissue: Secondary | ICD-10-CM | POA: Diagnosis not present

## 2014-11-14 DIAGNOSIS — Z87442 Personal history of urinary calculi: Secondary | ICD-10-CM | POA: Diagnosis not present

## 2014-11-14 DIAGNOSIS — Y998 Other external cause status: Secondary | ICD-10-CM | POA: Insufficient documentation

## 2014-11-14 DIAGNOSIS — X58XXXA Exposure to other specified factors, initial encounter: Secondary | ICD-10-CM | POA: Insufficient documentation

## 2014-11-14 DIAGNOSIS — I10 Essential (primary) hypertension: Secondary | ICD-10-CM | POA: Insufficient documentation

## 2014-11-14 MED ORDER — EPINEPHRINE 0.3 MG/0.3ML IJ SOAJ
0.3000 mg | Freq: Once | INTRAMUSCULAR | Status: DC
Start: 2014-11-14 — End: 2016-10-23

## 2014-11-14 MED ORDER — FAMOTIDINE IN NACL 20-0.9 MG/50ML-% IV SOLN
20.0000 mg | Freq: Once | INTRAVENOUS | Status: AC
  Administered 2014-11-14: 20 mg via INTRAVENOUS
  Filled 2014-11-14: qty 50

## 2014-11-14 MED ORDER — HYDROXYZINE HCL 50 MG PO TABS: 50.0000 mg | ORAL_TABLET | Freq: Three times a day (TID) | ORAL | Status: DC | PRN

## 2014-11-14 MED ORDER — FAMOTIDINE 20 MG PO TABS: 20.0000 mg | ORAL_TABLET | Freq: Two times a day (BID) | ORAL | Status: DC

## 2014-11-14 MED ORDER — DIPHENHYDRAMINE HCL 25 MG PO TABS: 25.0000 mg | ORAL_TABLET | Freq: Four times a day (QID) | ORAL | Status: AC

## 2014-11-14 MED ORDER — HYDROXYZINE HCL 25 MG PO TABS
50.0000 mg | ORAL_TABLET | Freq: Once | ORAL | Status: AC
  Administered 2014-11-14: 50 mg via ORAL
  Filled 2014-11-14: qty 2

## 2014-11-14 MED ORDER — PREDNISONE 10 MG PO TABS: 60.0000 mg | ORAL_TABLET | Freq: Every day | ORAL | Status: DC

## 2014-11-14 MED ORDER — METHYLPREDNISOLONE SODIUM SUCC 125 MG IJ SOLR
125.0000 mg | Freq: Once | INTRAMUSCULAR | Status: AC
  Administered 2014-11-14: 125 mg via INTRAVENOUS
  Filled 2014-11-14: qty 2

## 2014-11-14 NOTE — ED Notes (Signed)
Pt reporting allergic reaction to unknown substance.  States that she took 75 mg of Benadryl at 0230 and used epipen at 0245. Reporting minimal improvement.

## 2014-11-14 NOTE — ED Notes (Signed)
Pt reporting improvement in symptoms.  Resting comfortably. No distress noted.

## 2014-11-14 NOTE — Discharge Instructions (Signed)

## 2014-11-14 NOTE — ED Provider Notes (Signed)
CSN: [id number]     Arrival date & time 11/14/14  0301 History   First MD Initiated Contact with Patient 11/14/14 0302     No chief complaint on file.     HPI Patient reports developing diffuse itching and hives of her scalp and of her feet.  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  She took Benadryl initially it wasn't getting better and began having some sense of shortness of breath therefore she gave herself an EpiPen at home.  On arrival to emergency department she is feeling somewhat better this time.  She still feels slightly abnormal in her chest along tightness or throat.  She reports the itching is somewhat improved.  Her symptoms are mild in severity.  They are improving however.  She has been intubated before for anaphylaxis.  Past Medical History  Diagnosis Date  . Hypertension   . Supraventricular tachycardia   . Kidney stone   . Anaphylaxis     Onions  . UTI (lower urinary tract infection) 02/08/2014  . Epidermal cyst 02/08/2014  . Peri-menopausal 02/08/2014   Past Surgical History  Procedure Laterality Date  . Tubal ligation    . Uterine abla    . Cesarean section    . Stent in kidney    . Endometrial ablation     Family History  Problem Relation Age of Onset  . Cancer Mother   . Hypertension Mother   . Hypertension Father   . Hyperlipidemia Father   . Hyperlipidemia Brother   . Anxiety disorder Daughter   . Other Son     lymphedema  . Hypertension Maternal Grandmother   . Other Maternal Grandmother     CVA  . Alzheimer's disease Maternal Grandmother   . Cancer Maternal Grandfather     liver, lung  . Other Paternal Grandmother     CVA  . Alzheimer's disease Paternal Grandmother   . Heart disease Paternal Grandfather   . Heart attack Paternal Grandfather   . Other Son     lung contusion; liver lac   History  Substance Use Topics  . Smoking status: Never Smoker   . Smokeless tobacco: Never Used  . Alcohol Use: Yes     Comment: occcasional   OB  History    Gravida Para Term Preterm AB TAB SAB Ectopic Multiple Living   4 3   1  1   1      Review of Systems  All other systems reviewed and are negative.     Allergies  Onion and Sulfonamide derivatives  Home Medications   Prior to Admission medications   Medication Sig Start Date End Date Taking? Authorizing Provider  aspirin 81 MG tablet Take 81 mg by mouth daily.    Historical Provider, MD  EPINEPHrine (EPI-PEN) 0.3 mg/0.3 mL DEVI Inject 0.3 mLs (0.3 mg total) into the muscle once. 11/05/12   Babs SciaraScott A Luking, MD  ibuprofen (ADVIL,MOTRIN) 800 MG tablet Take 800 mg by mouth as needed.    Historical Provider, MD  metoprolol tartrate (LOPRESSOR) 25 MG tablet Take 1 tablet (25 mg total) by mouth 2 (two) times daily. 09/27/14   Antoine PocheJonathan F Branch, MD  triamcinolone cream (KENALOG) 0.1 % Use BID prn rash up to 2 weeks at a time 05/04/13   Campbell Richesarolyn C Hoskins, NP   BP 155/87 mmHg  Pulse 88  Temp(Src) 98.2 F (36.8 C) (Oral)  Resp 24  SpO2 96%  LMP 10/14/2014 Physical Exam  Constitutional: She is oriented  to person, place, and time. She appears well-developed and well-nourished. No distress.  HENT:  Head: Normocephalic and atraumatic.  Eyes: EOM are normal.  Neck: Normal range of motion.  Cardiovascular: Normal rate, regular rhythm and normal heart sounds.   Pulmonary/Chest: Effort normal and breath sounds normal. No stridor.  Abdominal: Soft. She exhibits no distension. There is no tenderness.  Musculoskeletal: Normal range of motion.  Neurological: She is alert and oriented to person, place, and time.  Skin: Skin is warm and dry.  Psychiatric: She has a normal mood and affect. Judgment normal.  Nursing note and vitals reviewed.   ED Course  Procedures (including critical care time) Labs Review Labs Reviewed - No data to display  Imaging Review No results found.   EKG Interpretation None      MDM   Final diagnoses:  None    Patient was observed in the  emergency department.  She was responding and improving even prior to my evaluation likely from her EpiPen at home.  She was observed in the emergency department for several hours and seems be doing much better.  Discharge home with standard allergic reaction precautions.  No stridor.  She understands to return the emergency department for new or worsening symptoms.    Azalia Bilis, MD 11/14/14 [phone]

## 2015-01-20 ENCOUNTER — Telehealth: Payer: Self-pay | Admitting: Adult Health

## 2015-01-20 MED ORDER — NYSTATIN-TRIAMCINOLONE 100000-0.1 UNIT/GM-% EX CREA: 1.0000 "application " | TOPICAL_CREAM | Freq: Two times a day (BID) | CUTANEOUS | Status: DC

## 2015-01-20 MED ORDER — FLUCONAZOLE 150 MG PO TABS: ORAL_TABLET | ORAL | Status: DC

## 2015-01-20 NOTE — Telephone Encounter (Signed)
Pt is complaining of yeast infection, had dental procedure and has been on augmentin,has tried yogurt, will rx diflucan and mytrex

## 2015-03-17 ENCOUNTER — Other Ambulatory Visit: Payer: Self-pay

## 2015-03-17 MED ORDER — METOPROLOL TARTRATE 25 MG PO TABS: 25.0000 mg | ORAL_TABLET | Freq: Two times a day (BID) | ORAL | Status: DC

## 2015-03-17 NOTE — Telephone Encounter (Signed)
REFILLED METOPROLOL

## 2015-06-21 MED FILL — METOPROLOL TARTRATE 25 MG T: 25 | 90 days supply | Qty: 180 | Fill #0

## 2015-10-05 ENCOUNTER — Emergency Department (HOSPITAL_COMMUNITY): Payer: PRIVATE HEALTH INSURANCE

## 2015-10-05 ENCOUNTER — Encounter (HOSPITAL_COMMUNITY): Payer: Self-pay | Admitting: Emergency Medicine

## 2015-10-05 ENCOUNTER — Emergency Department (HOSPITAL_COMMUNITY)
Admission: EM | Admit: 2015-10-05 | Discharge: 2015-10-05 | Disposition: A | Discharge status: Home / Self Care | Payer: PRIVATE HEALTH INSURANCE | Attending: Emergency Medicine | Admitting: Emergency Medicine

## 2015-10-05 DIAGNOSIS — I10 Essential (primary) hypertension: Secondary | ICD-10-CM | POA: Insufficient documentation

## 2015-10-05 DIAGNOSIS — Y92238 Other place in hospital as the place of occurrence of the external cause: Secondary | ICD-10-CM | POA: Diagnosis not present

## 2015-10-05 DIAGNOSIS — Y999 Unspecified external cause status: Secondary | ICD-10-CM | POA: Diagnosis not present

## 2015-10-05 DIAGNOSIS — X58XXXA Exposure to other specified factors, initial encounter: Secondary | ICD-10-CM | POA: Insufficient documentation

## 2015-10-05 DIAGNOSIS — S9031XA Contusion of right foot, initial encounter: Secondary | ICD-10-CM | POA: Insufficient documentation

## 2015-10-05 DIAGNOSIS — Y9389 Activity, other specified: Secondary | ICD-10-CM | POA: Diagnosis not present

## 2015-10-05 DIAGNOSIS — S99921A Unspecified injury of right foot, initial encounter: Secondary | ICD-10-CM | POA: Diagnosis present

## 2015-10-05 MED ORDER — IBUPROFEN 800 MG PO TABS
800.0000 mg | ORAL_TABLET | Freq: Once | ORAL | Status: AC
  Administered 2015-10-05: 800 mg via ORAL
  Filled 2015-10-05: qty 1
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
## 2015-10-05 NOTE — Discharge Instructions (Signed)
Foot Contusion  A foot contusion is a deep bruise to the foot. Contusions happen when an injury causes bleeding under the skin. Signs of bruising include pain, puffiness (swelling), and discolored skin. The contusion may turn blue, purple, or yellow. HOME CARE  Put ice on the injured area.  Put ice in a plastic bag.  Place a towel between your skin and the bag.  Leave the ice on for 15-20 minutes, 03-04 times a day.  Only take medicines as told by your doctor.  Use an elastic wrap only as told. You may remove the wrap for sleeping, showering, and bathing. Take the wrap off if you lose feeling (numb) in your toes, or they turn blue or cold. Put the wrap on more loosely.  Keep the foot raised (elevated) with pillows.  If your foot hurts, avoid standing or walking.  When your doctor says it is okay to use your foot, start using it slowly. If you have pain, lessen how much you use your foot.  See your doctor as told. GET HELP RIGHT AWAY IF:   You have more redness, puffiness, or pain in your foot.  Your puffiness or pain does not get better with medicine.  You lose feeling in your foot, or you cannot move your toes.  Your foot turns cold or blue.  You have pain when you move your toes.  Your foot feels warm.  Your contusion does not get better in 2 days. MAKE SURE YOU:   Understand these instructions.  Will watch this condition.  Will get help right away if you or your child is not doing well or gets worse.   This information is not intended to replace advice given to you by your health care provider. Make sure you discuss any questions you have with your health care provider.   Document Released: 03/06/2008 Document Revised: 11/27/2011 Document Reviewed: 02/01/2015 [employer] Interactive Patient Education 2016 [employer]  

## 2015-10-06 NOTE — ED Provider Notes (Signed)
CSN: [id number]     Arrival date & time 10/05/15  2135 History   First MD Initiated Contact with Patient 10/05/15 2146     Chief Complaint  Patient presents with  . Foot Injury     (Consider location/radiation/quality/duration/timing/severity/associated sxs/prior Treatment) HPI   Judy Harrison is a 50 y.o. female who presents to the Emergency Department complaining of sudden onset of right foot pain this evening.  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  She reports a throbbing pain to the top of her foot with a small area of redness and swelling.  Pain is worse with weight bearing.  She has applied ice packs with minimal relief.  She denies open wound, numbness, ankle pain or pain with movement of her toes.     Past Medical History  Diagnosis Date  . Hypertension   . Supraventricular tachycardia (HCC)   . Kidney stone   . Anaphylaxis     Onions  . UTI (lower urinary tract infection) 02/08/2014  . Epidermal cyst 02/08/2014  . Peri-menopausal 02/08/2014   Past Surgical History  Procedure Laterality Date  . Tubal ligation    . Uterine abla    . Cesarean section    . Stent in kidney    . Endometrial ablation     Family History  Problem Relation Age of Onset  . Cancer Mother   . Hypertension Mother   . Hypertension Father   . Hyperlipidemia Father   . Hyperlipidemia Brother   . Anxiety disorder Daughter   . Other Son     lymphedema  . Hypertension Maternal Grandmother   . Other Maternal Grandmother     CVA  . Alzheimer's disease Maternal Grandmother   . Cancer Maternal Grandfather     liver, lung  . Other Paternal Grandmother     CVA  . Alzheimer's disease Paternal Grandmother   . Heart disease Paternal Grandfather   . Heart attack Paternal Grandfather   . Other Son     lung contusion; liver lac   Social History  Substance Use Topics  . Smoking status: Never  Smoker   . Smokeless tobacco: Never Used  . Alcohol Use: Yes     Comment: occcasional   OB History    Gravida Para Term Preterm AB TAB SAB Ectopic Multiple Living   4 3   1  1   1      Review of Systems  Constitutional: Negative for fever and chills.  Musculoskeletal: Positive for joint swelling and arthralgias (right foot pain).  Skin: Negative for color change and wound.  Neurological: Negative for weakness and numbness.  All other systems reviewed and are negative.     Allergies  Onion and Sulfonamide derivatives  Home Medications   Prior to Admission medications   Medication Sig Start Date End Date Taking? Authorizing Provider  aspirin 81 MG tablet Take 81 mg by mouth daily.    Historical Provider, MD  diphenhydrAMINE (BENADRYL) 25 MG tablet Take 1 tablet (25 mg total) by mouth every 6 (six) hours. 11/14/14   Azalia BilisKevin Campos, MD  EPINEPHrine 0.3 mg/0.3 mL IJ SOAJ injection Inject 0.3 mLs (0.3 mg total) into the muscle once. 11/14/14   Azalia BilisKevin Campos, MD  famotidine (PEPCID) 20 MG tablet Take 1 tablet (20 mg total) by mouth 2 (two) times daily. 11/14/14   Azalia BilisKevin Campos, MD  fluconazole (DIFLUCAN) 150  MG tablet Take 1 now and 1 in 3 days 01/20/15   Adline Potter, NP  hydrOXYzine (ATARAX/VISTARIL) 50 MG tablet Take 1 tablet (50 mg total) by mouth every 8 (eight) hours as needed for itching. 11/14/14   Azalia Bilis, MD  ibuprofen (ADVIL,MOTRIN) 800 MG tablet Take 800 mg by mouth as needed.    Historical Provider, MD  metoprolol tartrate (LOPRESSOR) 25 MG tablet Take 1 tablet (25 mg total) by mouth 2 (two) times daily. 03/17/15   Antoine Poche, MD  nystatin-triamcinolone (MYCOLOG II) cream Apply 1 application topically 2 (two) times daily. 01/20/15   Adline Potter, NP  predniSONE (DELTASONE) 10 MG tablet Take 6 tablets (60 mg total) by mouth daily. 11/14/14   Azalia Bilis, MD  triamcinolone cream (KENALOG) 0.1 % Use BID prn rash up to 2 weeks at a time 05/04/13   Campbell Riches, NP    BP 155/92 mmHg  Pulse 78  Temp(Src) 99.1 F (37.3 C) (Oral)  Resp 16  Ht  (1.575 m)  Wt 76.204 kg  BMI 30.72 kg/m2  LMP 09/28/2015 Physical Exam  Constitutional: She is oriented to person, place, and time. She appears well-developed and well-nourished. No distress.  HENT:  Head: Normocephalic and atraumatic.  Cardiovascular: Normal rate, regular rhythm, normal heart sounds and intact distal pulses.   Pulmonary/Chest: Effort normal and breath sounds normal.  Musculoskeletal: She exhibits tenderness.  Focal tenderness of the dorsal right foot.  Quarter sized area of erythema with minimal edema.  DP pulse is brisk,distal sensation intact.  No bruising or bony deformity.  No proximal tenderness.  Neurological: She is alert and oriented to person, place, and time. She exhibits normal muscle tone. Coordination normal.  Skin: Skin is warm and dry.  Nursing note and vitals reviewed.   ED Course  Procedures (including critical care time) Labs Review Labs Reviewed - No data to display  Imaging Review Dg Foot Complete Right  10/05/2015  CLINICAL DATA:  Bed pushed over right foot, with pain and erythema about the metatarsals. Initial encounter. EXAM: RIGHT FOOT COMPLETE - 3+ VIEW COMPARISON:  None. FINDINGS: There is no evidence of fracture or dislocation. 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 The joint spaces are preserved. There is no evidence of talar subluxation; the subtalar joint is unremarkable in appearance. A plantar calcaneal spur is noted. No significant soft tissue abnormalities are seen. IMPRESSION: 1. No evidence of fracture or dislocation. 2. 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 Electronically Signed   By: Roanna Raider M.D.   On: 10/05/2015 22:18   I have personally reviewed and evaluated these images and lab results as part of my medical decision-making.   EKG Interpretation None      MDM   Final diagnoses:  Contusion, foot, right,  initial encounter    Pt is well appearing, small contusion to dorsal right foot without open wound.  XR neg for fx.  She agrees to symptomatic tx , ibuprofen if needed.  Agrees to PMD f/u if needed.   ACE wrap applied, pain improved.  Remains NV intact.    Pauline Aus, PA-C 10/06/15 0107  Loren Racer, MD 10/10/15 [phone]

## 2015-10-31 MED FILL — METOPROLOL TARTRATE 25 MG T: 25 | 90 days supply | Qty: 180 | Fill #0

## 2016-01-24 DIAGNOSIS — H52222 Regular astigmatism, left eye: Secondary | ICD-10-CM | POA: Diagnosis not present

## 2016-01-24 DIAGNOSIS — H524 Presbyopia: Secondary | ICD-10-CM | POA: Diagnosis not present

## 2016-01-24 DIAGNOSIS — H5213 Myopia, bilateral: Secondary | ICD-10-CM | POA: Diagnosis not present

## 2016-03-05 ENCOUNTER — Telehealth: Payer: Self-pay | Admitting: Cardiology

## 2016-03-05 NOTE — Telephone Encounter (Signed)
Error/tg °

## 2016-03-06 ENCOUNTER — Ambulatory Visit: Payer: Self-pay | Admitting: Cardiology

## 2016-03-09 ENCOUNTER — Ambulatory Visit: Payer: Self-pay | Admitting: Adult Health

## 2016-03-09 ENCOUNTER — Encounter: Payer: Self-pay | Admitting: Adult Health

## 2016-03-09 ENCOUNTER — Ambulatory Visit (INDEPENDENT_AMBULATORY_CARE_PROVIDER_SITE_OTHER): Payer: 59 | Admitting: Adult Health

## 2016-03-09 VITALS — BP 130/90 | HR 71 | Ht 62.5 in | Wt 170.0 lb

## 2016-03-09 DIAGNOSIS — R079 Chest pain, unspecified: Secondary | ICD-10-CM

## 2016-03-09 DIAGNOSIS — R0602 Shortness of breath: Secondary | ICD-10-CM

## 2016-03-09 DIAGNOSIS — E785 Hyperlipidemia, unspecified: Secondary | ICD-10-CM | POA: Diagnosis not present

## 2016-03-09 NOTE — Progress Notes (Signed)
Cardiology Office Note   Date:  03/09/2016   ID:  Toini, Failla [birth year], MRN [id number]  PCP:  Lubertha South, MD  Cardiologist: Arlington Calix, NP   Chief Complaint  Patient presents with  . 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 She was found to have PSVT. She was last seen by Dr. Wyline Mood in May 2016 with no recurrent symptoms and remained on metoprolol. Her hypertension was well-controlled.  She is here today with multiple complaints. She speaks at length about family stressors with children and in-laws along with personal issues causing her a great deal of anxiety. As a result she has been having a good bit of chest discomfort fatigue, frequent palpitations with an episode of PSVT. She used Valsalva technique to slow her heart down.  As result of her increased palpitations, fatigue, chest pressure she wishes to have follow-up cardiology appointment and testing. She denies worsening shortness of breath or dizziness. She does experience some dyspnea associated with rapid heart rhythm.   Past Medical History:  Diagnosis Date  . Anaphylaxis    Onions  . Epidermal cyst 02/08/2014  . Hypertension   . Kidney stone   . Peri-menopausal 02/08/2014  . Supraventricular tachycardia (HCC)   . UTI (lower urinary tract infection) 02/08/2014    Past Surgical History:  Procedure Laterality Date  . CESAREAN SECTION    . ENDOMETRIAL ABLATION    . stent in kidney    . TUBAL LIGATION    . uterine abla       Current Outpatient Prescriptions  Medication Sig Dispense Refill  . aspirin 81 MG tablet Take 81 mg by mouth daily.    . diphenhydrAMINE (BENADRYL) 25 MG tablet Take 1 tablet (25 mg total) by mouth every 6 (six) hours. 12 tablet 0  . EPINEPHrine 0.3 mg/0.3 mL IJ SOAJ injection Inject 0.3 mLs (0.3 mg total) into the  muscle once. 1 Device 4  . ibuprofen (ADVIL,MOTRIN) 800 MG tablet Take 800 mg by mouth as needed.    . metoprolol tartrate (LOPRESSOR) 25 MG tablet Take 1 tablet (25 mg total) by mouth 2 (two) times daily. 180 tablet 3   No current facility-administered medications for this visit.     Allergies:   Onion and Sulfonamide derivatives    Social History:  The patient  reports that she has never smoked. She has never used smokeless tobacco. She reports that she drinks alcohol. She reports that she does not use drugs.   Family History:  The patient's family history includes Alzheimer's disease in her maternal grandmother and paternal grandmother; Anxiety disorder in her daughter; Cancer in her maternal grandfather and mother; Heart attack in her paternal grandfather; Heart disease in her paternal grandfather; Hyperlipidemia in her brother and father; Hypertension in her father, maternal grandmother, and mother; Other in her maternal grandmother, paternal grandmother, son, and son.    ROS: All other systems are reviewed and negative. Unless otherwise mentioned in H&P    PHYSICAL EXAM: VS:  BP 130/90   Pulse 71   Ht 5' 2.5" (1.588 m)   Wt 170 lb (77.1 kg)   SpO2 98%   BMI 30.60 kg/m  , BMI Body mass index is 30.6 kg/m. GEN: Well nourished, well developed, in no acute distress  HEENT: normal  Neck: no JVD, carotid bruits,  or masses Cardiac: RRR; no murmurs, rubs, or gallops,no edema  Respiratory:  clear to auscultation bilaterally, normal work of breathing GI: soft, nontender, nondistended, + BS MS: no deformity or atrophy  Skin: warm and dry, no rash Neuro:  Strength and sensation are intact Psych: euthymic mood,  Tearful.  Recent Labs: No results found for requested labs within last 8760 hours.    Lipid Panel    Component Value Date/Time   CHOL 220 (H) 04/09/2014 1115   TRIG 215.0 (H) 04/09/2014 1115   HDL 35.40 (L) 04/09/2014 1115   CHOLHDL 6 04/09/2014 1115   VLDL 43.0 (H)  04/09/2014 1115   LDLDIRECT 140.0 04/09/2014 1115      Wt Readings from Last 3 Encounters:  03/09/16 170 lb (77.1 kg)  10/05/15 168 lb (76.2 kg)  11/09/14 173 lb (78.5 kg)    ASSESSMENT AND PLAN:  1.  Chest pressure: I believe this is multifactorial in the setting of severe family stressors and personal stressors. She is not sleeping well and is anxious. She has cardiovascular risk factors of hypercholesterolemia, hypertension, and stress.  I will plan a nuclear medicine stress test, echocardiogram, also check some labs to evaluate for anemia, TSH, electrolyte abnormalities. Also check a hemoglobin A1c. She will follow back up after tests are completed for further discussion and need for more testing. She is advised to hold metoprolol the night before and the morning of her stress test. She verbalizes understanding.  2. Situational anxiety and stress: I believe this is the likely reason for her symptoms. If cardiac testing is reassuring would recommend psychiatric counseling to assist with stress and anxiety.  3. Hypercholesterolemia: We'll check cholesterol status with labs.   Current medicines are reviewed at length with the patient today.    Labs/ tests ordered today include: BMET, CBC, lipid study, TSH, hemoglobin A1c.   Orders Placed This Encounter  Procedures  . NM Myocar Multi W/Spect W/Wall Motion / EF  . Basic Metabolic Panel (BMET)  . TSH  . CBC with Differential  . HgB A1c  . ECHOCARDIOGRAM COMPLETE     Disposition:   FU with one month   Signed, Joni ReiningKathryn Lawrence, NP  03/09/2016 5:28 PM    [facility] [address], [place], [place] [address] Phone: [phone]; Fax: [phone]

## 2016-03-09 NOTE — Progress Notes (Signed)
Name: Judy Harrison    DOB: 01-Feb-1966  Age: 50 y.o.  MR#: [id number]       PCP:  Mickie Hillier, MD      Insurance: Payor: [employer] / Plan: [employer] / Product Type: *No Product type* /   CC:   No chief complaint on file.   VS Vitals:   03/09/16 1519  BP: 130/90  Pulse: 71  SpO2: 98%  Weight: 170 lb (77.1 kg)  Height: 5' 2.5" (1.588 m)    Weights Current Weight  03/09/16 170 lb (77.1 kg)  10/05/15 168 lb (76.2 kg)  11/09/14 173 lb (78.5 kg)    Blood Pressure  BP Readings from Last 3 Encounters:  03/09/16 130/90  10/05/15 155/92  11/14/14 155/87     Admit date:  (Not on file) Last encounter with [facility]:  Visit date not found   Allergy Onion and Sulfonamide derivatives  Current Outpatient Prescriptions  Medication Sig Dispense Refill  . aspirin 81 MG tablet Take 81 mg by mouth daily.    . diphenhydrAMINE (BENADRYL) 25 MG tablet Take 1 tablet (25 mg total) by mouth every 6 (six) hours. 12 tablet 0  . EPINEPHrine 0.3 mg/0.3 mL IJ SOAJ injection Inject 0.3 mLs (0.3 mg total) into the muscle once. 1 Device 4  . ibuprofen (ADVIL,MOTRIN) 800 MG tablet Take 800 mg by mouth as needed.    . metoprolol tartrate (LOPRESSOR) 25 MG tablet Take 1 tablet (25 mg total) by mouth 2 (two) times daily. 180 tablet 3   No current facility-administered medications for this visit.     Discontinued Meds:    Medications Discontinued During This Encounter  Medication Reason  . famotidine (PEPCID) 20 MG tablet Error  . fluconazole (DIFLUCAN) 150 MG tablet Error  . hydrOXYzine (ATARAX/VISTARIL) 50 MG tablet Error  . nystatin-triamcinolone (MYCOLOG II) cream Error  . predniSONE (DELTASONE) 10 MG tablet Error  . triamcinolone cream (KENALOG) 0.1 % Error    Patient Active Problem List   Diagnosis Date Noted  . UTI (lower urinary tract infection) 02/08/2014  . Epidermal cyst 02/08/2014  . Peri-menopausal 02/08/2014  . PALPITATIONS 07/03/2010  . SUPRAVENTRICULAR TACHYCARDIA  06/30/2010  . THRUSH 04/26/2008  . PNEUMONIA 04/26/2008  . PYELONEPHRITIS 04/26/2008  . NEPHROLITHIASIS 04/26/2008  . BACK PAIN, CHRONIC 04/26/2008  . RESPIRATORY DISTRESS 04/26/2008  . SEPSIS 04/26/2008    LABS    Component Value Date/Time   NA 136 03/10/2014 1652   NA 139 01/31/2009 1119   NA 141 04/17/2008 0630   K 4.3 03/10/2014 1652   K 4.1 01/31/2009 1119   K 3.7 04/17/2008 0630   CL 105 03/10/2014 1652   CL 105 01/31/2009 1119   CL 106 04/17/2008 0630   CO2 25 03/10/2014 1652   CO2 27 01/31/2009 1119   CO2 29 04/17/2008 0630   GLUCOSE 100 (H) 03/10/2014 1652   GLUCOSE 105 (H) 01/31/2009 1119   GLUCOSE 123 (H) 04/17/2008 0630   BUN 9 03/10/2014 1652   BUN 10 01/31/2009 1119   BUN 5 (L) 04/17/2008 0630   CREATININE 0.7 03/10/2014 1652   CREATININE 0.70 01/31/2009 1119   CREATININE 0.72 04/17/2008 0630   CALCIUM 9.6 03/10/2014 1652   CALCIUM 9.3 01/31/2009 1119   CALCIUM 9.2 04/17/2008 0630   GFRNONAA >60 01/31/2009 1119   GFRNONAA >60 04/17/2008 0630   GFRNONAA >60 04/16/2008 0401   GFRAA  01/31/2009 1119    >60  The eGFR has been calculated using the MDRD equation. This calculation has not been validated in all clinical situations. eGFR's persistently <60 mL/min signify possible Chronic Kidney Disease.   GFRAA  04/17/2008 0630    >60        The eGFR has been calculated using the MDRD equation. This calculation has not been validated in all clinical   GFRAA  04/16/2008 0401    >60        The eGFR has been calculated using the MDRD equation. This calculation has not been validated in all clinical   CMP     Component Value Date/Time   NA 136 03/10/2014 1652   K 4.3 03/10/2014 1652   CL 105 03/10/2014 1652   CO2 25 03/10/2014 1652   GLUCOSE 100 (H) 03/10/2014 1652   BUN 9 03/10/2014 1652   CREATININE 0.7 03/10/2014 1652   CALCIUM 9.6 03/10/2014 1652   PROT 7.1 03/10/2014 1652   ALBUMIN 4.2 03/10/2014 1652   AST 27 03/10/2014 1652    ALT 31 03/10/2014 1652   ALKPHOS 53 03/10/2014 1652   BILITOT 0.7 03/10/2014 1652   GFRNONAA >60 01/31/2009 1119   GFRAA  01/31/2009 1119    >60        The eGFR has been calculated using the MDRD equation. This calculation has not been validated in all clinical situations. eGFR's persistently <60 mL/min signify possible Chronic Kidney Disease.       Component Value Date/Time   WBC 6.0 03/10/2014 1652   WBC 6.5 04/16/2008 0401   WBC 3.7 (L) 04/15/2008 0410   HGB 13.3 03/10/2014 1652   HGB 12.2 05/24/2008 1520   HGB 9.5 (L) 04/16/2008 0401   HCT 39.2 03/10/2014 1652   HCT 36.0 05/24/2008 1520   HCT 27.6 (L) 04/16/2008 0401   MCV 89.8 03/10/2014 1652   MCV 85.3 04/16/2008 0401   MCV 85.6 04/15/2008 0410    Lipid Panel     Component Value Date/Time   CHOL 220 (H) 04/09/2014 1115   TRIG 215.0 (H) 04/09/2014 1115   HDL 35.40 (L) 04/09/2014 1115   CHOLHDL 6 04/09/2014 1115   VLDL 43.0 (H) 04/09/2014 1115   LDLDIRECT 140.0 04/09/2014 1115    ABG    Component Value Date/Time   PHART 7.462 (H) 04/14/2008 1043   PCO2ART 26.8 (L) 04/14/2008 1043   PO2ART 49.8 (L) 04/14/2008 1043   HCO3 18.9 (L) 04/14/2008 1043   TCO2 15.9 04/14/2008 1043   ACIDBASEDEF 4.3 (H) 04/14/2008 1043   O2SAT 87.8 04/14/2008 1043     Lab Results  Component Value Date   TSH 1.68 03/10/2014   BNP (last 3 results) No results for input(s): BNP in the last 8760 hours.  ProBNP (last 3 results) No results for input(s): PROBNP in the last 8760 hours.  Cardiac Panel (last 3 results) No results for input(s): CKTOTAL, CKMB, TROPONINI, RELINDX in the last 72 hours.  Iron/TIBC/Ferritin/ %Sat No results found for: IRON, TIBC, FERRITIN, IRONPCTSAT   EKG Orders placed or performed in visit on 11/09/14  . EKG 12-Lead     Prior Assessment and Plan Problem List as of 03/09/2016 Reviewed: 11/09/2014 10:46 AM by Carlyle Dolly, MD     Cardiovascular and Mediastinum   SUPRAVENTRICULAR TACHYCARDIA      Respiratory   PNEUMONIA     Digestive   THRUSH     Musculoskeletal and Integument   Epidermal cyst     Genitourinary   PYELONEPHRITIS   NEPHROLITHIASIS  UTI (lower urinary tract infection)     Other   BACK PAIN, CHRONIC   RESPIRATORY DISTRESS   SEPSIS   PALPITATIONS   Peri-menopausal       Imaging: No results found.

## 2016-03-09 NOTE — Patient Instructions (Signed)
Medication Instructions:  Your physician recommends that you continue on your current medications as directed. Please refer to the Current Medication list given to you today.   Labwork: Your physician recommends that you return for lab work in: ASAP CBC CMET TSH A1C   Testing/Procedures: Your physician has requested that you have en exercise stress myoview. For further information please visit [email or web address]. Please follow instruction sheet, as given.   Your physician has requested that you have an echocardiogram. Echocardiography is a painless test that uses sound waves to create images of your heart. It provides your doctor with information about the size and shape of your heart and how well your heart's chambers and valves are working. This procedure takes approximately one hour. There are no restrictions for this procedure.      Follow-Up: Your physician recommends that you schedule a follow-up appointment in: 3 WEEKS    Any Other Special Instructions Will Be Listed Below (If Applicable).     If you need a refill on your cardiac medications before your next appointment, please call your pharmacy.

## 2016-03-13 MED FILL — METOPROLOL TARTRATE 25 MG T: 25 | 90 days supply | Qty: 180 | Fill #1

## 2016-03-22 DIAGNOSIS — R0602 Shortness of breath: Secondary | ICD-10-CM | POA: Diagnosis not present

## 2016-03-22 DIAGNOSIS — R079 Chest pain, unspecified: Secondary | ICD-10-CM | POA: Diagnosis not present

## 2016-03-22 DIAGNOSIS — E785 Hyperlipidemia, unspecified: Secondary | ICD-10-CM | POA: Diagnosis not present

## 2016-03-22 LAB — CBC WITH DIFFERENTIAL/PLATELET
BASOS PCT: 0 %
Basophils Absolute: 0 cells/uL (ref 0–200)
EOS PCT: 5 %
Eosinophils Absolute: 320 cells/uL (ref 15–500)
HEMATOCRIT: 38.8 % (ref 35.0–45.0)
HEMOGLOBIN: 13.3 g/dL (ref 11.7–15.5)
LYMPHS ABS: 2048 {cells}/uL (ref 850–3900)
Lymphocytes Relative: 32 %
MCH: 29.9 pg (ref 27.0–33.0)
MCHC: 34.3 g/dL (ref 32.0–36.0)
MCV: 87.2 fL (ref 80.0–100.0)
MONO ABS: 512 {cells}/uL (ref 200–950)
MPV: 8.8 fL (ref 7.5–12.5)
Monocytes Relative: 8 %
NEUTROS PCT: 55 %
Neutro Abs: 3520 cells/uL (ref 1500–7800)
Platelets: 198 10*3/uL (ref 140–400)
RBC: 4.45 MIL/uL (ref 3.80–5.10)
RDW: 14.4 % (ref 11.0–15.0)
WBC: 6.4 10*3/uL (ref 3.8–10.8)

## 2016-03-22 LAB — BASIC METABOLIC PANEL
BUN: 9 mg/dL (ref 7–25)
CHLORIDE: 103 mmol/L (ref 98–110)
CO2: 28 mmol/L (ref 20–31)
Calcium: 9.5 mg/dL (ref 8.6–10.4)
Creat: 0.66 mg/dL (ref 0.50–1.05)
Glucose, Bld: 106 mg/dL — ABNORMAL HIGH (ref 65–99)
POTASSIUM: 3.8 mmol/L (ref 3.5–5.3)
SODIUM: 138 mmol/L (ref 135–146)

## 2016-03-22 LAB — TSH: TSH: 3.7 m[IU]/L

## 2016-03-23 ENCOUNTER — Telehealth: Payer: Self-pay

## 2016-03-23 ENCOUNTER — Encounter (HOSPITAL_COMMUNITY): Payer: Self-pay

## 2016-03-23 ENCOUNTER — Encounter (HOSPITAL_COMMUNITY)
Admission: RE | Admit: 2016-03-23 | Discharge: 2016-03-23 | Disposition: A | Discharge status: Home / Self Care | Payer: 59 | Source: Ambulatory Visit | Attending: Adult Health | Admitting: Adult Health

## 2016-03-23 ENCOUNTER — Inpatient Hospital Stay (HOSPITAL_COMMUNITY): Admission: RE | Admit: 2016-03-23 | Payer: Self-pay | Source: Ambulatory Visit

## 2016-03-23 ENCOUNTER — Ambulatory Visit (HOSPITAL_BASED_OUTPATIENT_CLINIC_OR_DEPARTMENT_OTHER)
Admission: RE | Admit: 2016-03-23 | Discharge: 2016-03-23 | Disposition: A | Discharge status: Home / Self Care | Payer: 59 | Source: Ambulatory Visit | Attending: Adult Health | Admitting: Adult Health

## 2016-03-23 DIAGNOSIS — R079 Chest pain, unspecified: Secondary | ICD-10-CM

## 2016-03-23 DIAGNOSIS — R0602 Shortness of breath: Secondary | ICD-10-CM | POA: Diagnosis not present

## 2016-03-23 LAB — NM MYOCAR MULTI W/SPECT W/WALL MOTION / EF
CHL CUP MPHR: 170 {beats}/min
CHL CUP NUCLEAR SDS: 6
CHL CUP NUCLEAR SRS: 0
CHL CUP NUCLEAR SSS: 6
CHL CUP RESTING HR STRESS: 67 {beats}/min
CHL RATE OF PERCEIVED EXERTION: 13
CSEPED: 9 min
CSEPEDS: 0 s
CSEPEW: 10.1 METS
CSEPHR: 90 %
CSEPPHR: 153 {beats}/min
LV dias vol: 41 mL (ref 46–106)
LVSYSVOL: 8 mL
RATE: 0
TID: 0.77

## 2016-03-23 LAB — HEMOGLOBIN A1C
HEMOGLOBIN A1C: 6 % — AB (ref <5.7)
Mean Plasma Glucose: 126 mg/dL

## 2016-03-23 MED ORDER — TECHNETIUM TC 99M TETROFOSMIN IV KIT
10.0000 | PACK | Freq: Once | INTRAVENOUS | Status: AC | PRN
  Administered 2016-03-23: 10.1 via INTRAVENOUS

## 2016-03-23 MED ORDER — SODIUM CHLORIDE 0.9% FLUSH
INTRAVENOUS | Status: AC
  Administered 2016-03-23: 10 mL via INTRAVENOUS
  Filled 2016-03-23: qty 10

## 2016-03-23 MED ORDER — TECHNETIUM TC 99M TETROFOSMIN IV KIT
30.0000 | PACK | Freq: Once | INTRAVENOUS | Status: AC | PRN
  Administered 2016-03-23: 30.3 via INTRAVENOUS

## 2016-03-23 MED ORDER — REGADENOSON 0.4 MG/5ML IV SOLN
INTRAVENOUS | Status: AC
  Filled 2016-03-23: qty 5

## 2016-03-23 NOTE — Telephone Encounter (Signed)
-----   Message from Jodelle GrossKathryn M Lawrence, NP sent at 03/23/2016  6:26 AM EDT ----- Hgb A1C is slightly elevated. Diet control is necessary. Please send to PCP

## 2016-03-23 NOTE — Telephone Encounter (Signed)
Called pt, no answer- left message for pt to call back.

## 2016-03-23 NOTE — Progress Notes (Signed)
*  PRELIMINARY RESULTS* Echocardiogram 2D Echocardiogram has been performed.  Judy Harrison, Judy Harrison 03/23/2016, 12:12 PM

## 2016-03-26 ENCOUNTER — Telehealth: Payer: Self-pay | Admitting: Adult Health

## 2016-03-26 NOTE — Telephone Encounter (Signed)
Pt left VM for a return call regarding some testing results please give her a call @ [phone]

## 2016-03-26 NOTE — Telephone Encounter (Signed)
LM to call back-cc 

## 2016-04-02 ENCOUNTER — Ambulatory Visit: Payer: Self-pay | Admitting: Adult Health

## 2016-04-02 NOTE — Progress Notes (Deleted)
Cardiology Office Note   Date:  04/02/2016   ID:  Kagan, Hietpas [birth year], MRN [id number]  PCP:  Lubertha South, MD  Cardiologist: Arlington Calix, NP   No chief complaint on file.      History of Present Illness: Judy VINCIGUERRA is a 50 y.o. female who presents for ongoing assessment and management of palpitations and hypertension. She has a history of PSVT. She was last seen  The office on 03/09/2016 with increased palpaitations fatigue and  chest pressure. She also is under a lot of family stress and spoke openly about this. She was sent for echocardiogram, and nuclear medicine stress test.  Study Result     Blood pressure demonstrated a normal response to exercise.  The study is normal.  This is a low risk study.  The left ventricular ejection fraction is normal (55-65%).  There was no ST segment deviation noted during stress.   Normal resting and stress perfusion. No ischemia or infarction EF 81%    Echocardiogram 03/23/2016  - Left ventricle: The cavity size was normal. Wall thickness was   increased in a pattern of mild LVH. Systolic function was normal.   The estimated ejection fraction was in the range of 60% to 65%.   Wall motion was normal; there were no regional wall motion   abnormalities. Left ventricular diastolic function parameters   were normal. - Atrial septum: No defect or patent foramen ovale was identified.  Labs were completed on 03/22/2016, all of which were within normal range.   Past Medical History:  Diagnosis Date  . Anaphylaxis    Onions  . Epidermal cyst 02/08/2014  . Hypertension   . Kidney stone   . Peri-menopausal 02/08/2014  . Supraventricular tachycardia (HCC)   . UTI (lower urinary tract infection) 02/08/2014    Past Surgical History:  Procedure Laterality Date  . CESAREAN SECTION    . ENDOMETRIAL ABLATION    . stent in kidney    . TUBAL LIGATION    . uterine abla       Current Outpatient Prescriptions   Medication Sig Dispense Refill  . aspirin 81 MG tablet Take 81 mg by mouth daily.    . diphenhydrAMINE (BENADRYL) 25 MG tablet Take 1 tablet (25 mg total) by mouth every 6 (six) hours. 12 tablet 0  . EPINEPHrine 0.3 mg/0.3 mL IJ SOAJ injection Inject 0.3 mLs (0.3 mg total) into the muscle once. 1 Device 4  . ibuprofen (ADVIL,MOTRIN) 800 MG tablet Take 800 mg by mouth as needed.    . metoprolol tartrate (LOPRESSOR) 25 MG tablet Take 1 tablet (25 mg total) by mouth 2 (two) times daily. 180 tablet 3   No current facility-administered medications for this visit.     Allergies:   Onion and Sulfonamide derivatives    Social History:  The patient  reports that she has never smoked. She has never used smokeless tobacco. She reports that she drinks alcohol. She reports that she does not use drugs.   Family History:  The patient's family history includes Alzheimer's disease in her maternal grandmother and paternal grandmother; Anxiety disorder in her daughter; Cancer in her maternal grandfather and mother; Heart attack in her paternal grandfather; Heart disease in her paternal grandfather; Hyperlipidemia in her brother and father; Hypertension in her father, maternal grandmother, and mother; Other in her maternal grandmother, paternal grandmother, son, and son.    ROS: All other systems are reviewed and negative. Unless otherwise mentioned in  H&P    PHYSICAL EXAM: VS:  There were no vitals taken for this visit. , BMI There is no height or weight on file to calculate BMI. GEN: Well nourished, well developed, in no acute distress  HEENT: normal  Neck: no JVD, carotid bruits, or masses Cardiac: ***RRR; no murmurs, rubs, or gallops,no edema  Respiratory:  clear to auscultation bilaterally, normal work of breathing GI: soft, nontender, nondistended, + BS MS: no deformity or atrophy  Skin: warm and dry, no rash Neuro:  Strength and sensation are intact Psych: euthymic mood, full affect   EKG:   EKG {ACTION; IS/IS [id number]}OT:[id number]} ordered today. The ekg ordered today demonstrates ***   Recent Labs: 03/22/2016: BUN 9; Creat 0.66; Hemoglobin 13.3; Platelets 198; Potassium 3.8; Sodium 138; TSH 3.70    Lipid Panel    Component Value Date/Time   CHOL 220 (H) 04/09/2014 1115   TRIG 215.0 (H) 04/09/2014 1115   HDL 35.40 (L) 04/09/2014 1115   CHOLHDL 6 04/09/2014 1115   VLDL 43.0 (H) 04/09/2014 1115   LDLDIRECT 140.0 04/09/2014 1115      Wt Readings from Last 3 Encounters:  03/09/16 170 lb (77.1 kg)  10/05/15 168 lb (76.2 kg)  11/09/14 173 lb (78.5 kg)      Other studies Reviewed: Additional studies/ records that were reviewed today include: ***. Review of the above records demonstrates: ***   ASSESSMENT AND PLAN:  1.  ***   Current medicines are reviewed at length with the patient today.    Labs/ tests ordered today include: *** No orders of the defined types were placed in this encounter.    Disposition:   FU with *** in {gen number [id number]}0-10:[id number]} {TIME; UNITS DAY/WEEK/MONTH:19136}   Signed, Joni ReiningKathryn Abrielle Finck, NP  04/02/2016 7:21 AM    [facility] [address], [place], [place] [address] Phone: [phone]; Fax: [phone]

## 2016-07-19 ENCOUNTER — Other Ambulatory Visit: Payer: Self-pay | Admitting: Cardiology

## 2016-07-19 MED FILL — METOPROLOL TARTRATE 25 MG T: 25 | 90 days supply | Qty: 180 | Fill #0

## 2016-09-03 ENCOUNTER — Encounter: Payer: Self-pay | Admitting: Family Medicine

## 2016-09-03 ENCOUNTER — Ambulatory Visit (INDEPENDENT_AMBULATORY_CARE_PROVIDER_SITE_OTHER): Payer: 59 | Admitting: Family Medicine

## 2016-09-03 VITALS — BP 132/88 | Temp 98.3°F | Ht 62.0 in | Wt 169.2 lb

## 2016-09-03 DIAGNOSIS — J4520 Mild intermittent asthma, uncomplicated: Secondary | ICD-10-CM | POA: Diagnosis not present

## 2016-09-03 DIAGNOSIS — J329 Chronic sinusitis, unspecified: Secondary | ICD-10-CM | POA: Diagnosis not present

## 2016-09-03 MED ORDER — FLUCONAZOLE 150 MG PO TABS
ORAL_TABLET | ORAL | 0 refills | Status: DC
Start: 2016-09-03 — End: 2017-04-16

## 2016-09-03 MED ORDER — AMOXICILLIN-POT CLAVULANATE 875-125 MG PO TABS: 1.0000 | ORAL_TABLET | Freq: Two times a day (BID) | ORAL | 0 refills | Status: AC

## 2016-09-03 MED ORDER — ALBUTEROL SULFATE HFA 108 (90 BASE) MCG/ACT IN AERS: 2.0000 | INHALATION_SPRAY | Freq: Four times a day (QID) | RESPIRATORY_TRACT | 2 refills | Status: DC | PRN

## 2016-09-03 NOTE — Progress Notes (Signed)
   Subjective:    Patient ID: Judy Harrison, female    DOB: December 28, 1965, 51 y.o.   MRN: [id number]  Cough  This is a new problem. The current episode started in the past 7 days. Associated symptoms include ear pain, a fever, headaches, rhinorrhea, a sore throat and wheezing. Treatments tried: Ibuprofen, Benadryl, Nyquil.    Came on gradual, cough and cong and drNAGE  [place] [employer] [place] [employer] ON Friday,  Went ot the graduation  Started with runny nose and cough and cong Started with allergy symtoms  Pt used inhaler in the past having whezing mostly at night   Sig wheezing   Coughing feels tight and wheezy  , Patient states no other concerns this visit.  Review of Systems  Constitutional: Positive for fever.  HENT: Positive for ear pain, rhinorrhea and sore throat.   Respiratory: Positive for cough and wheezing.   Neurological: Positive for headaches.       Objective:   Physical Exam Alert, mild malaise. Hydration good Vitals stable. frontal/ maxillary tenderness evident positive nasal congestion. pharynx normal neck supple  lungs clear/no crackles  w, However positive for wheezes. heart regular in rhythm        Assessment & Plan:  Impression rhinosinusitis/bronchitis with element of reactive airways likely post viral, discussed with patient. plan antibiotics prescribed. Albuterol prescribed. Questions answered. Symptomatic care discussed. warning signs discussed. WSL

## 2016-10-23 ENCOUNTER — Other Ambulatory Visit: Payer: Self-pay

## 2016-10-23 ENCOUNTER — Telehealth: Payer: Self-pay | Admitting: Family Medicine

## 2016-10-23 MED ORDER — EPINEPHRINE 0.3 MG/0.3ML IJ SOAJ: 0.3000 mg | Freq: Once | INTRAMUSCULAR | 5 refills | Status: AC

## 2016-10-23 MED ORDER — EPINEPHRINE 0.3 MG/0.3ML IJ SOAJ: 0.3000 mg | Freq: Once | INTRAMUSCULAR | 4 refills | Status: DC

## 2016-10-23 MED ORDER — DOXYCYCLINE HYCLATE 100 MG PO TABS: 100.0000 mg | ORAL_TABLET | Freq: Two times a day (BID) | ORAL | 0 refills | Status: AC

## 2016-10-23 MED FILL — DOXYCYCLINE HYCLATE 100 MG: 100 | 10 days supply | Qty: 20 | Fill #0

## 2016-10-23 NOTE — Telephone Encounter (Signed)
Spoke with patient and informed her per Dr.Steve Luking- Tick borne rashes not hive like, would recommend  holding off on meds but  if you t still want antibiotics for  coverage, we can give you a prescription for  Doxycycline  100  twice a day for days, use sun protection while taking medication. Also we are sending in a refill on your Epipen. Patient's mother verbalized understanding.

## 2016-10-23 NOTE — Telephone Encounter (Signed)
Spoke with patient and patient stated that she removed tick from between great toe and second toe today.Noticed Hive like rash a few days ago. Hives are to legs and arms and some on trunk of body. Patient states she unsure if the rash and the tick bite are related. She says that the tick was very small and when she removed it she removed the entire tick. Denies fever. [place]. She would also like a refill on her epipen. Please advise?

## 2016-10-23 NOTE — Telephone Encounter (Signed)
Left message return call 10/23/2016 

## 2016-10-23 NOTE — Telephone Encounter (Signed)
Refill epi pen  Tick born rashes not hive like, would rec holding off on meds bu t  if pt still wants abx coverage, give doxy 100 bid ten d, use sun protection, refill epipen

## 2016-10-23 NOTE — Telephone Encounter (Signed)
Patient said she found a tick in between her toes.  She pulled the tick off but she is having an allergic reaction rash.  She is going on a cruise in 3 days and would like a nurse to call her back regarding what she needs to do.

## 2016-10-23 NOTE — Telephone Encounter (Signed)
Left message return call 10/23/16 

## 2016-11-20 MED FILL — METOPROLOL TARTRATE 25 MG T: 25 | 90 days supply | Qty: 180 | Fill #1

## 2017-04-11 ENCOUNTER — Telehealth: Payer: Self-pay | Admitting: Adult Health

## 2017-04-11 MED ORDER — METOPROLOL TARTRATE 25 MG PO TABS: 25.0000 mg | ORAL_TABLET | Freq: Two times a day (BID) | ORAL | 0 refills | Status: DC

## 2017-04-11 NOTE — Telephone Encounter (Signed)
Medication refilled

## 2017-04-11 NOTE — Telephone Encounter (Signed)
° ° ° °  1. Which medications need to be refilled? (please list name of each medication and dose if known) metoprolol tartrate (LOPRESSOR) 25 MG tablet [[id number]][[id number]]   2. Which pharmacy/location (including street and city if local pharmacy) is medication to be sent to? [facility]  Pt is scheduled to see KL on 04/16/17--she's needing a few to hold her over till then

## 2017-04-15 NOTE — Progress Notes (Signed)
Cardiology Office Note   Date:  04/16/2017   ID:  Judy Harrison, DOB 02-13-1966, MRN [id number]  PCP:  Merlyn AlbertLuking, William S, MD  Cardiologist: Dina RichJonathan Branch, MD  Chief Complaint  Patient presents with  . Palpitations      History of Present Illness: Judy Harrison is a 51 y.o. female who presents for ongoing assessment and management of palpitations and hypertension.  She has a history of PSVT.  The office on 03/09/2016.  At which time she was complaining of multiple family stressors, anxiety, and repeated episodes of palpitations.  I plan a nuclear medicine stress test, echocardiogram, and check some labs to evaluate for anemia and electrolyte abnormalities.  She was advised to follow-up with her primary care physician in the setting of anxiety and depression for need to be referred to psychiatry for counseling if clinically warranted.  Nuclear medicine study:  Blood pressure demonstrated a normal response to exercise.  The study is normal.  This is a low risk study.  The left ventricular ejection fraction is normal (55-65%).  There was no ST segment deviation noted during stress.   Normal resting and stress perfusion. No ischemia or infarction EF 81%  Echocardiogram was also found to be normal with normal LV systolic function.  The patient was continued on current medication regimen.  She is here for follow-up and medication refills.  She comes today feeling very well without any recurrent complaints.  She believes a lot of her symptoms are related to family stress.  She is feeling better and is working on reducing her stress.  She has been losing weight and exercising.  She is medically compliant.  Past Medical History:  Diagnosis Date  . Anaphylaxis    Onions  . Epidermal cyst 02/08/2014  . Hypertension   . Kidney stone   . Peri-menopausal 02/08/2014  . Supraventricular tachycardia (HCC)   . UTI (lower urinary tract infection) 02/08/2014    Past Surgical History:    Procedure Laterality Date  . CESAREAN SECTION    . ENDOMETRIAL ABLATION    . stent in kidney    . TUBAL LIGATION    . uterine abla       Current Outpatient Medications  Medication Sig Dispense Refill  . aspirin 81 MG tablet Take 81 mg by mouth daily.    . diphenhydrAMINE (BENADRYL) 25 MG tablet Take 1 tablet (25 mg total) by mouth every 6 (six) hours. 12 tablet 0  . ibuprofen (ADVIL,MOTRIN) 800 MG tablet Take 800 mg by mouth as needed.    . metoprolol tartrate (LOPRESSOR) 25 MG tablet Take 1 tablet (25 mg total) 2 (two) times daily by mouth. 60 tablet 10  . hydrochlorothiazide (HYDRODIURIL) 12.5 MG tablet Take 1 tablet (12.5 mg total) daily by mouth. 30 tablet 3   No current facility-administered medications for this visit.     Allergies:   Beef-derived products; Onion; and Sulfonamide derivatives    Social History:  The patient  reports that  has never smoked. she has never used smokeless tobacco. She reports that she drinks alcohol. She reports that she does not use drugs.   Family History:  The patient's family history includes Alzheimer's disease in her maternal grandmother and paternal grandmother; Anxiety disorder in her daughter; Cancer in her maternal grandfather and mother; Heart attack in her paternal grandfather; Heart disease in her paternal grandfather; Hyperlipidemia in her brother and father; Hypertension in her father, maternal grandmother, and mother; Other in her maternal grandmother, paternal  grandmother, son, and son.    ROS: All other systems are reviewed and negative. Unless otherwise mentioned in H&P    PHYSICAL EXAM: VS:  BP 132/64   Pulse 68   Ht 5' 2.5" (1.588 m)   Wt 160 lb 6.4 oz (72.8 kg)   SpO2 98%   BMI 28.87 kg/m  , BMI Body mass index is 28.87 kg/m. GEN: Well nourished, well developed, in no acute distress  HEENT: normal  Neck: no JVD, carotid bruits, or masses Cardiac:RRR; no murmurs, rubs, or gallops,no edema  Respiratory:  clear to  auscultation bilaterally, normal work of breathing GI: soft, nontender, nondistended, + BS MS: no deformity or atrophy  Skin: warm and dry, no rash Neuro:  Strength and sensation are intact Psych: euthymic mood, full affect   Recent Labs: No results found for requested labs within last 8760 hours.    Lipid Panel    Component Value Date/Time   CHOL 220 (H) 04/09/2014 1115   TRIG 215.0 (H) 04/09/2014 1115   HDL 35.40 (L) 04/09/2014 1115   CHOLHDL 6 04/09/2014 1115   VLDL 43.0 (H) 04/09/2014 1115   LDLDIRECT 140.0 04/09/2014 1115      Wt Readings from Last 3 Encounters:  04/16/17 160 lb 6.4 oz (72.8 kg)  09/03/16 169 lb 4 oz (76.8 kg)  03/09/16 170 lb (77.1 kg)      ASSESSMENT AND PLAN:  1.  PSVT: She is without complaints of recurrent palpitation or racing heart rate.  We will continue metoprolol 25 mg twice daily with refills given.  He is reducing her stress and increasing her exercise which is been helpful to her.  2.  Fluid retention: She is perimenopausal occasionally has some fluid retention.  I have given her a prescription for HCTZ 12.5 mg to use as needed only.  She is following up with her OB/GYN Dr. Emelda Fear for ongoing evaluation for menopause.   Current medicines are reviewed at length with the patient today.    Labs/ tests ordered today include:   Bettey Mare. Liborio Nixon, ANP, AACC   04/16/2017 4:56 PM    [facility] [address], [place], [place] [address] Phone: [phone]; Fax: [phone]

## 2017-04-16 ENCOUNTER — Ambulatory Visit: Payer: 59 | Admitting: Adult Health

## 2017-04-16 ENCOUNTER — Encounter: Payer: Self-pay | Admitting: Adult Health

## 2017-04-16 VITALS — BP 132/64 | HR 68 | Ht 62.5 in | Wt 160.4 lb

## 2017-04-16 DIAGNOSIS — I471 Supraventricular tachycardia: Secondary | ICD-10-CM | POA: Diagnosis not present

## 2017-04-16 DIAGNOSIS — R002 Palpitations: Secondary | ICD-10-CM | POA: Diagnosis not present

## 2017-04-16 MED ORDER — HYDROCHLOROTHIAZIDE 12.5 MG PO TABS
12.5000 mg | ORAL_TABLET | Freq: Every day | ORAL | 3 refills | Status: DC
Start: 2017-04-16 — End: 2017-10-23

## 2017-04-16 MED ORDER — METOPROLOL TARTRATE 25 MG PO TABS
25.0000 mg | ORAL_TABLET | Freq: Two times a day (BID) | ORAL | 10 refills | Status: DC
Start: 2017-04-16 — End: 2018-06-19

## 2017-04-16 MED FILL — METOPROLOL TARTRATE 25 MG T: 25 | 30 days supply | Qty: 60 | Fill #0

## 2017-04-16 MED FILL — HYDROCHLOROTHIAZIDE 12.5 MG: 12.5 | 30 days supply | Qty: 30 | Fill #0

## 2017-04-16 NOTE — Patient Instructions (Signed)
Medication Instructions:  Your physician recommends that you continue on your current medications as directed. Please refer to the Current Medication list given to you today. Start HCTZ 12.5 mg As Needed Daily   Labwork: NONE   Testing/Procedures: NONE   Follow-Up: Your physician wants you to follow-up in: 1 Year.  You will receive a reminder letter in the mail two months in advance. If you don't receive a letter, please call our office to schedule the follow-up appointment.   Any Other Special Instructions Will Be Listed Below (If Applicable). Thank you for choosing [facility]!      If you need a refill on your cardiac medications before your next appointment, please call your pharmacy.

## 2017-05-23 MED FILL — HYDROCHLOROTHIAZIDE 12.5 MG: 12.5 | 30 days supply | Qty: 30 | Fill #1

## 2017-06-06 MED FILL — METOPROLOL TARTRATE 25 MG T: 25 | 30 days supply | Qty: 60 | Fill #1

## 2017-06-28 MED FILL — HYDROCHLOROTHIAZIDE 12.5 MG: 12.5 | 30 days supply | Qty: 30 | Fill #2

## 2017-08-06 MED FILL — METOPROLOL TARTRATE 25 MG T: 25 | 30 days supply | Qty: 60 | Fill #2

## 2017-09-16 MED FILL — HYDROCHLOROTHIAZIDE 12.5 MG: 12.5 | 30 days supply | Qty: 30 | Fill #3

## 2017-09-18 MED FILL — METOPROLOL TARTRATE 25 MG T: 25 | 30 days supply | Qty: 60 | Fill #3

## 2017-10-23 ENCOUNTER — Other Ambulatory Visit: Payer: Self-pay | Admitting: Cardiology

## 2017-10-23 MED ORDER — HYDROCHLOROTHIAZIDE 12.5 MG PO TABS: 12.5000 mg | ORAL_TABLET | Freq: Every day | ORAL | 0 refills | Status: DC

## 2017-10-23 MED FILL — HYDROCHLOROTHIAZIDE 12.5 MG: 12.5 | 90 days supply | Qty: 90 | Fill #0

## 2017-10-23 NOTE — Telephone Encounter (Signed)
RX sent

## 2017-10-23 NOTE — Telephone Encounter (Signed)
°*  STAT* If patient is at the pharmacy, call can be transferred to refill team.   1. Which medications need to be refilled? (please list name of each medication and dose if known)  hydrochlorothiazide (HYDRODIURIL) 12.5 MG tablet [[id number]] ENDED   2. Which pharmacy/location (including street and city if local pharmacy) is medication to be sent to? [facility]-- then sent to [facility]  3. Do they need a 30 day or 90 day supply?  90 day

## 2017-11-12 MED FILL — METOPROLOL TARTRATE 25 MG T: 25 | 30 days supply | Qty: 60 | Fill #4

## 2017-11-22 ENCOUNTER — Ambulatory Visit (INDEPENDENT_AMBULATORY_CARE_PROVIDER_SITE_OTHER): Payer: 59 | Admitting: Adult Health

## 2017-11-22 ENCOUNTER — Encounter: Payer: Self-pay | Admitting: Adult Health

## 2017-11-22 ENCOUNTER — Other Ambulatory Visit (HOSPITAL_COMMUNITY)
Admission: RE | Admit: 2017-11-22 | Discharge: 2017-11-22 | Disposition: A | Discharge status: Home / Self Care | Payer: 59 | Source: Ambulatory Visit | Attending: Adult Health | Admitting: Adult Health

## 2017-11-22 ENCOUNTER — Other Ambulatory Visit: Payer: Self-pay

## 2017-11-22 VITALS — BP 160/92 | HR 96 | Resp 18 | Ht 62.0 in | Wt 165.0 lb

## 2017-11-22 DIAGNOSIS — Z1321 Encounter for screening for nutritional disorder: Secondary | ICD-10-CM | POA: Diagnosis not present

## 2017-11-22 DIAGNOSIS — Z1212 Encounter for screening for malignant neoplasm of rectum: Secondary | ICD-10-CM | POA: Diagnosis not present

## 2017-11-22 DIAGNOSIS — R030 Elevated blood-pressure reading, without diagnosis of hypertension: Secondary | ICD-10-CM | POA: Diagnosis not present

## 2017-11-22 DIAGNOSIS — R7309 Other abnormal glucose: Secondary | ICD-10-CM

## 2017-11-22 DIAGNOSIS — Z1211 Encounter for screening for malignant neoplasm of colon: Secondary | ICD-10-CM | POA: Insufficient documentation

## 2017-11-22 DIAGNOSIS — Z01411 Encounter for gynecological examination (general) (routine) with abnormal findings: Secondary | ICD-10-CM

## 2017-11-22 DIAGNOSIS — E7800 Pure hypercholesterolemia, unspecified: Secondary | ICD-10-CM | POA: Diagnosis not present

## 2017-11-22 DIAGNOSIS — Z1329 Encounter for screening for other suspected endocrine disorder: Secondary | ICD-10-CM | POA: Diagnosis not present

## 2017-11-22 DIAGNOSIS — Z01419 Encounter for gynecological examination (general) (routine) without abnormal findings: Secondary | ICD-10-CM | POA: Insufficient documentation

## 2017-11-22 DIAGNOSIS — Z114 Encounter for screening for human immunodeficiency virus [HIV]: Secondary | ICD-10-CM

## 2017-11-22 LAB — HEMOCCULT GUIAC POC 1CARD (OFFICE): Fecal Occult Blood, POC: NEGATIVE

## 2017-11-22 NOTE — Progress Notes (Signed)
Patient ID: Judy Harrison, female   DOB: 20-Aug-1965, 52 y.o.   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  PCP is Lubertha SouthSteve Luking.She sees Bailey MechKatherine Lawrence NP for her SVT. She did not take lopressor this morning and has been hurrying to get her.    Current Medications, Allergies, Past Medical History, Past Surgical History, Family History and Social History were reviewed in [facility] electronic medical record.     Review of Systems: Patient denies any headaches, hearing loss, fatigue, blurred vision, shortness of breath, chest pain, abdominal pain, problems with bowel movements, urination, or intercourse(occasionally discomfort). No joint pain or mood swings.    Physical Exam:BP (!) 160/92 (BP Location: Left Arm, Cuff Size: Normal)   Pulse 96   Resp 18   Ht 5\' 2"  (1.575 m)   Wt 165 lb (74.8 kg)   BMI 30.18 kg/m  General:  Well developed, well nourished, no acute distress Skin:  Warm and dry,tan Neck:  Midline trachea, normal thyroid, good ROM, no lymphadenopathy Lungs; Clear to auscultation bilaterally Breast:  No dominant palpable mass, retraction, or nipple discharge Cardiovascular: Regular rate and rhythm Abdomen:  Soft, non tender, no hepatosplenomegaly Pelvic:  External genitalia is normal in appearance, +vitiligo on inner labia.  The vagina is normal in appearance. Urethra has no lesions or masses. The cervix is bulbous. Pap with HPV performed. Uterus is felt to be normal size, shape, and contour.  No adnexal masses or tenderness noted.Bladder is non tender, no masses felt. Rectal: Good sphincter tone, no polyps, or hemorrhoids felt.  Hemoccult negative. Extremities/musculoskeletal:  No swelling or varicosities noted, no clubbing or cyanosis Psych:  No mood changes, alert and cooperative,seems happy PHQ 2 score 0. Will check labs has not  had in about 2 years, orders given get fasting.   Impression: 1. Encounter for gynecological examination with Papanicolaou smear of cervix   2. Screening for colorectal cancer   3. Elevated BP without diagnosis of hypertension   4. Elevated cholesterol   5. Elevated hemoglobin A1c   6. Screening for HIV (human immunodeficiency virus)   7. Encounter for vitamin deficiency screening   8. Screening for thyroid disorder       Plan: Check CBC,CMP,TSH and lipids,A1c.vitamin D and HIV Take mircozide daily, instead of prn swelling Check BP at work and let me know results Mammogram advised Check insurance for cologaurd does not want colonoscopy  Elevated hips on pillows with sex, and pee before and after

## 2017-11-25 LAB — CYTOLOGY - PAP
DIAGNOSIS: NEGATIVE
HPV: NOT DETECTED

## 2018-01-01 MED FILL — METOPROLOL TARTRATE 25 MG T: 25 | 30 days supply | Qty: 60 | Fill #5

## 2018-02-06 ENCOUNTER — Other Ambulatory Visit: Payer: Self-pay | Admitting: Cardiology

## 2018-02-06 MED FILL — HYDROCHLOROTHIAZIDE 12.5 MG: 12.5 | 90 days supply | Qty: 90 | Fill #0

## 2018-03-04 MED FILL — METOPROLOL TARTRATE 25 MG T: 25 | 30 days supply | Qty: 60 | Fill #6

## 2018-03-29 IMAGING — DX DG FOOT COMPLETE 3+V*R*
3 series · 3 of 3 positions shown · non-contrast
Comparison: None.

CLINICAL DATA: Bed pushed over right foot, with pain and erythema
about the metatarsals. Initial encounter.

EXAM:
RIGHT FOOT COMPLETE - 3+ VIEW

[foot ap]
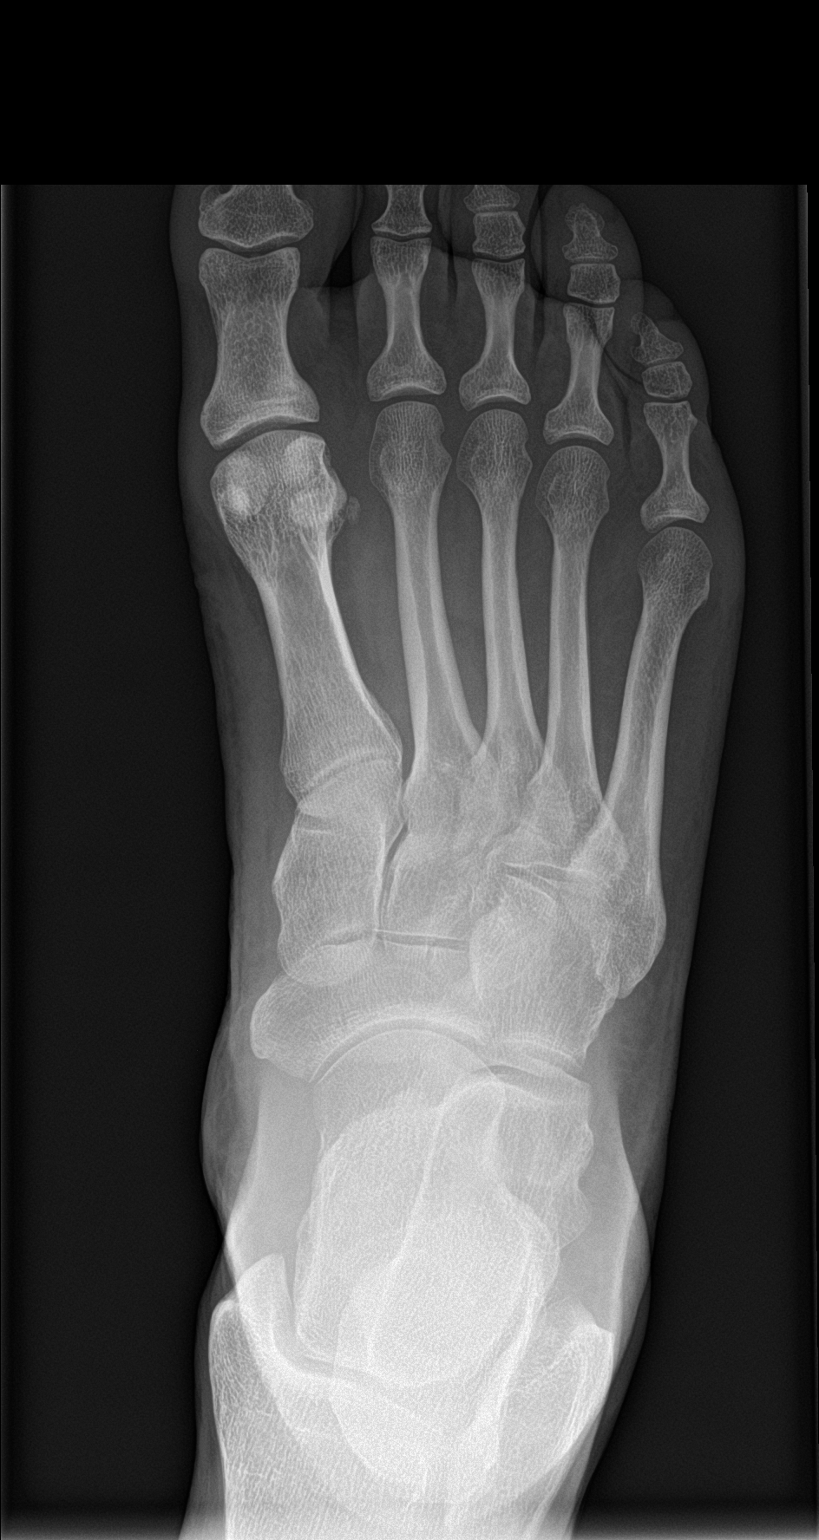

[foot obl]
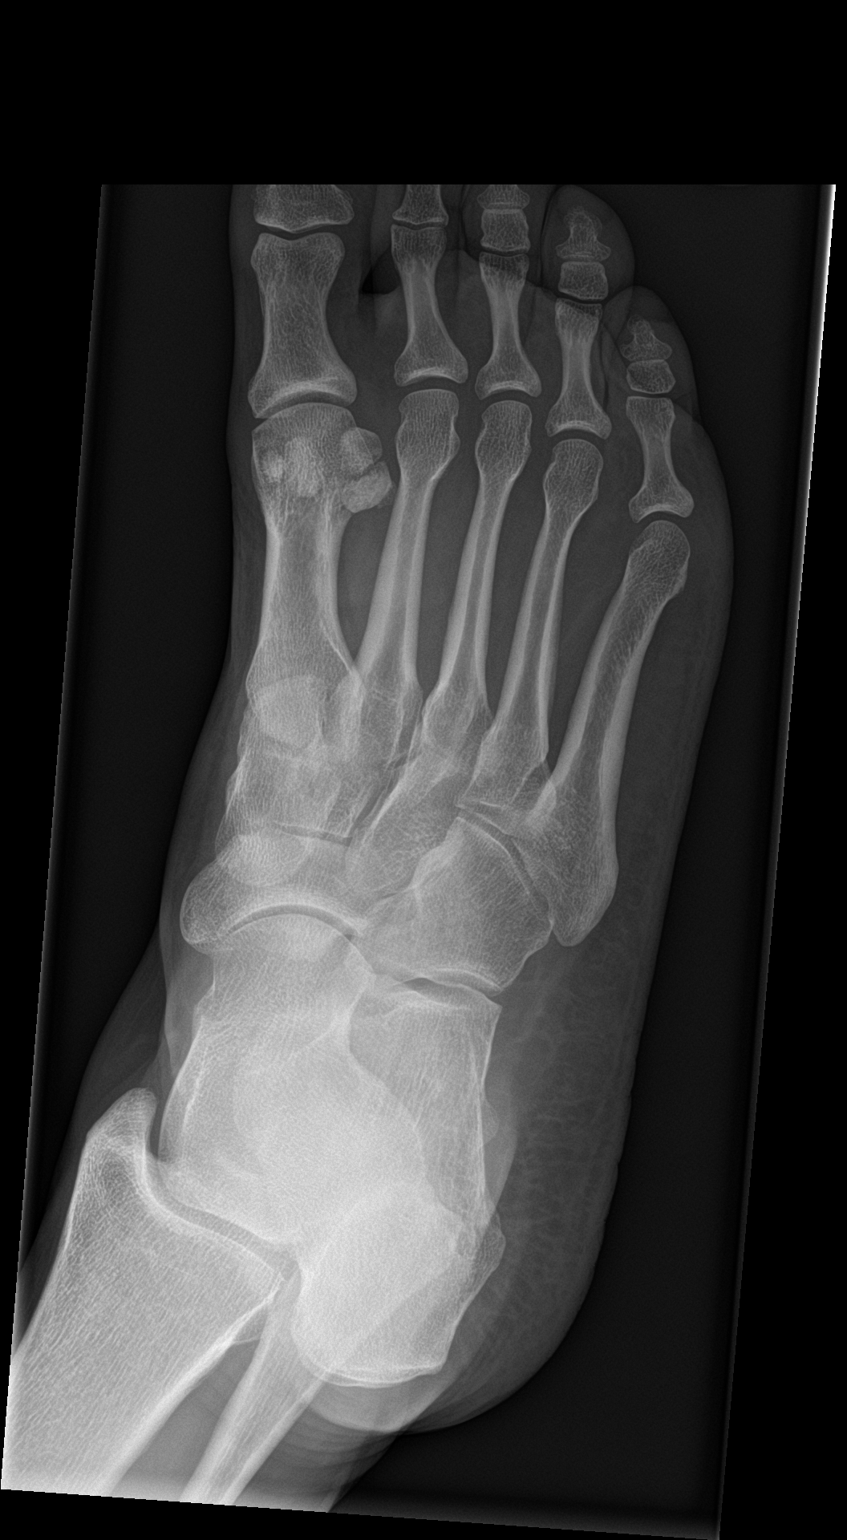

[foot lat]
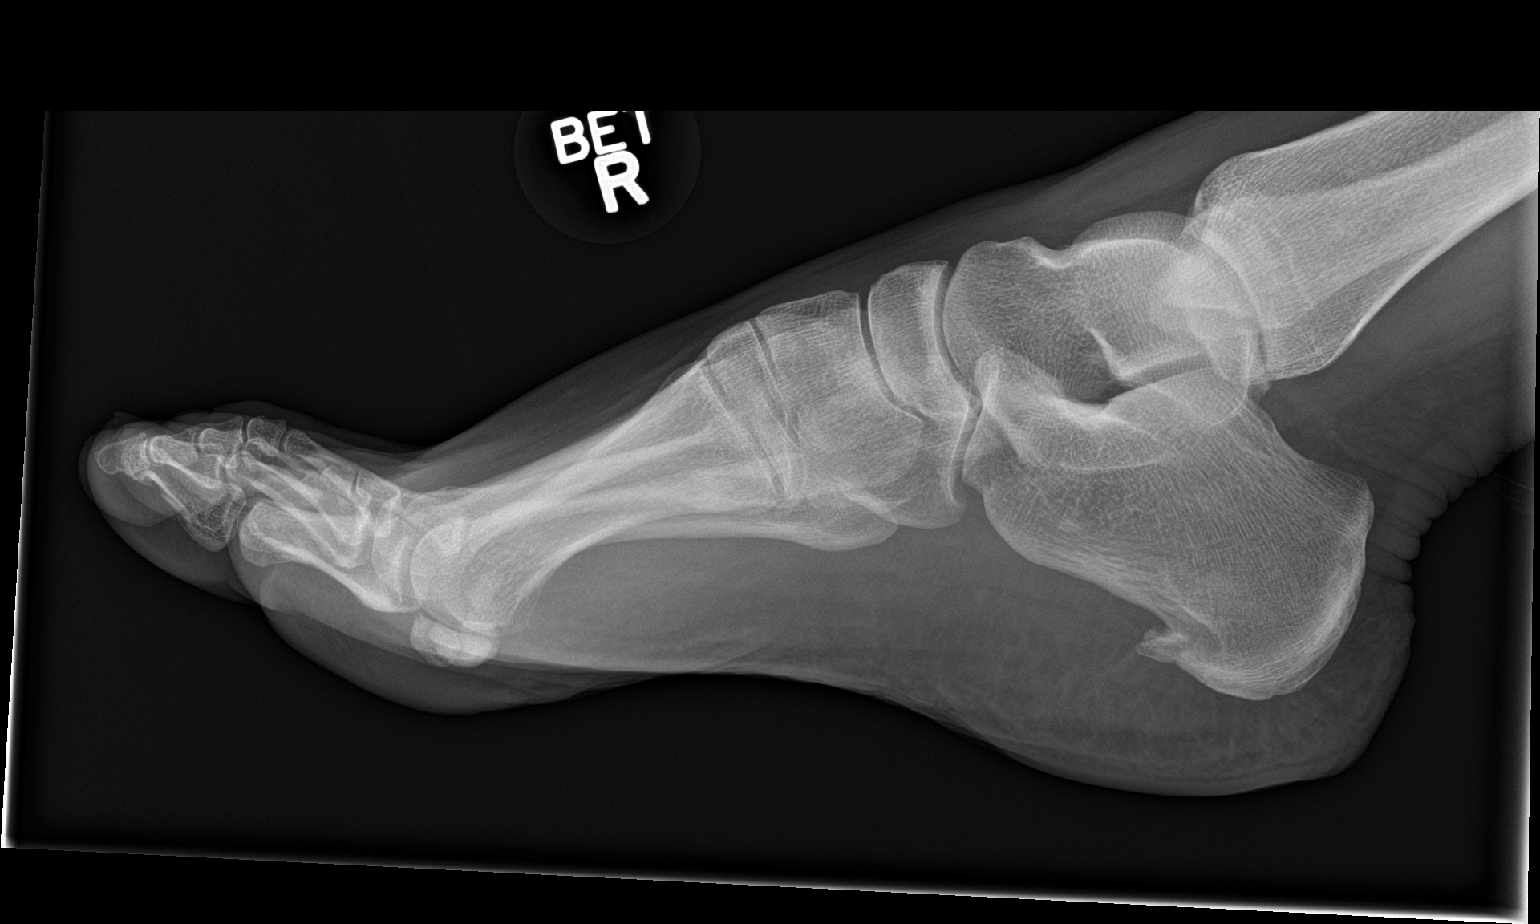

[3 of 3 positions shown; findings below may reference images not displayed]

FINDINGS: There is no evidence of fracture or dislocation. There is a
bipartite lateral sesamoid of the first toe. The joint spaces are
preserved. There is no evidence of talar subluxation; the subtalar
joint is unremarkable in appearance. A plantar calcaneal spur is
noted.

No significant soft tissue abnormalities are seen.
IMPRESSION: 1. No evidence of fracture or dislocation.
2. Bipartite lateral sesamoid of the first toe.

## 2018-04-03 MED FILL — METOPROLOL TARTRATE 25 MG T: 25 | 30 days supply | Qty: 60 | Fill #7

## 2018-05-05 MED FILL — HYDROCHLOROTHIAZIDE 12.5 MG: 12.5 | 90 days supply | Qty: 90 | Fill #1

## 2018-06-18 ENCOUNTER — Other Ambulatory Visit: Payer: Self-pay | Admitting: Adult Health

## 2018-06-19 MED FILL — METOPROLOL TARTRATE 25 MG T: 25 | 30 days supply | Qty: 60 | Fill #0

## 2018-07-25 ENCOUNTER — Encounter: Payer: Self-pay | Admitting: Family Medicine

## 2018-07-25 ENCOUNTER — Ambulatory Visit (INDEPENDENT_AMBULATORY_CARE_PROVIDER_SITE_OTHER): Payer: No Typology Code available for payment source | Admitting: Family Medicine

## 2018-07-25 VITALS — BP 148/86 | Temp 98.4°F | Wt 174.0 lb

## 2018-07-25 DIAGNOSIS — J111 Influenza due to unidentified influenza virus with other respiratory manifestations: Secondary | ICD-10-CM | POA: Diagnosis not present

## 2018-07-25 DIAGNOSIS — J019 Acute sinusitis, unspecified: Secondary | ICD-10-CM

## 2018-07-25 MED ORDER — AMOXICILLIN-POT CLAVULANATE 875-125 MG PO TABS: 1.0000 | ORAL_TABLET | Freq: Two times a day (BID) | ORAL | 0 refills | Status: DC

## 2018-07-25 MED ORDER — FLUCONAZOLE 150 MG PO TABS: 150.0000 mg | ORAL_TABLET | Freq: Once | ORAL | 0 refills | Status: AC

## 2018-07-25 MED ORDER — ALBUTEROL SULFATE HFA 108 (90 BASE) MCG/ACT IN AERS: 2.0000 | INHALATION_SPRAY | Freq: Four times a day (QID) | RESPIRATORY_TRACT | 0 refills | Status: DC | PRN

## 2018-07-25 NOTE — Progress Notes (Signed)
   Subjective:    Patient ID: Judy Harrison, female    DOB: June 08, 1966, 53 y.o.   MRN: [id number]  HPI  Patient is here today with a fever,cough,head and chest congestion, wheezing.  Has a history of pneumonia on multiple occasions, she wanted to makes sure this was not the start on a lung infection.  Symptoms ongoing for the last 3.5 days.  She has been using Dayquil and alt with Ibuprofen,benedryl qhs.  Reports a week ago started with upper respiratory symptoms, congestion and yellow/blood tinged mucous. 3-4 days ago started with malaise and all of a sudden had some chills, that night started coughing with fever. Reports some tightness and wheezing last night, but has improved some today. Alternating dayquil and ibuprofen. Cough feels like it should be productive but hasn't been able to get anything up.   Has been exposed to flu - works at [employer]. Reports past hx of pna   Staying hydrated, no appetite.    Review of Systems  Constitutional: Positive for chills and fever.  HENT: Positive for congestion and ear pain. Negative for ear discharge.   Respiratory: Positive for cough.   Gastrointestinal: Negative for diarrhea and vomiting.       Objective:   Physical Exam Vitals signs and nursing note reviewed.  Constitutional:      General: She is not in acute distress.    Appearance: She is not toxic-appearing.  HENT:     Head: Normocephalic and atraumatic.     Right Ear: Tympanic membrane normal.     Left Ear: Tympanic membrane normal.     Nose: Congestion present.     Right Sinus: Maxillary sinus tenderness present. No frontal sinus tenderness.     Left Sinus: No maxillary sinus tenderness or frontal sinus tenderness.     Mouth/Throat:     Mouth: Mucous membranes are moist.     Pharynx: Oropharynx is clear.  Eyes:     General:        Right eye: No discharge.        Left eye: No discharge.  Neck:     Musculoskeletal: Neck supple. No neck rigidity.  Cardiovascular:     Rate  and Rhythm: Normal rate and regular rhythm.     Heart sounds: Normal heart sounds.  Pulmonary:     Effort: Pulmonary effort is normal. No respiratory distress.     Breath sounds: Normal breath sounds. No wheezing or rales.     Comments: Bronchial congestion Lymphadenopathy:     Cervical: No cervical adenopathy.  Skin:    General: Skin is warm and dry.  Neurological:     Mental Status: She is alert and oriented to person, place, and time.  Psychiatric:        Behavior: Behavior normal.           Assessment & Plan:  Influenza  Acute rhinosinusitis  Discussed with patient likely post-influenza sinusitis. Will treat with abx. Symptomatic care discussed. Warning signs discussed. She should f/u if her symptoms worsen or fail to improve over the next several days. If severe over the weekend she should go to ED.   Dr. Lilyan Punt was consulted on this case and is in agreement with the above treatment plan.

## 2018-07-31 ENCOUNTER — Other Ambulatory Visit: Payer: Self-pay | Admitting: *Deleted

## 2018-07-31 MED ORDER — HYDROCHLOROTHIAZIDE 12.5 MG PO CAPS: 12.5000 mg | ORAL_CAPSULE | Freq: Every day | ORAL | 0 refills | Status: DC

## 2018-07-31 MED ORDER — METOPROLOL TARTRATE 25 MG PO TABS: 25.0000 mg | ORAL_TABLET | Freq: Two times a day (BID) | ORAL | 3 refills | Status: DC

## 2018-07-31 MED FILL — METOPROLOL TARTRATE 25 MG T: 25 | 30 days supply | Qty: 60 | Fill #0

## 2018-07-31 MED FILL — HYDROCHLOROTHIAZIDE 12.5 MG: 12.5 | 90 days supply | Qty: 90 | Fill #0

## 2018-08-08 ENCOUNTER — Other Ambulatory Visit: Payer: Self-pay | Admitting: Cardiology

## 2018-08-18 ENCOUNTER — Other Ambulatory Visit: Payer: Self-pay

## 2018-08-18 ENCOUNTER — Emergency Department (HOSPITAL_COMMUNITY): Payer: PRIVATE HEALTH INSURANCE

## 2018-08-18 ENCOUNTER — Emergency Department (HOSPITAL_COMMUNITY)
Admission: EM | Admit: 2018-08-18 | Discharge: 2018-08-18 | Disposition: A | Discharge status: Home / Self Care | Payer: PRIVATE HEALTH INSURANCE | Attending: Emergency Medicine | Admitting: Emergency Medicine

## 2018-08-18 ENCOUNTER — Encounter (HOSPITAL_COMMUNITY): Payer: Self-pay | Admitting: Emergency Medicine

## 2018-08-18 DIAGNOSIS — W1840XA Slipping, tripping and stumbling without falling, unspecified, initial encounter: Secondary | ICD-10-CM | POA: Insufficient documentation

## 2018-08-18 DIAGNOSIS — S86002A Unspecified injury of left Achilles tendon, initial encounter: Secondary | ICD-10-CM | POA: Insufficient documentation

## 2018-08-18 DIAGNOSIS — Z79899 Other long term (current) drug therapy: Secondary | ICD-10-CM | POA: Diagnosis not present

## 2018-08-18 DIAGNOSIS — Y9301 Activity, walking, marching and hiking: Secondary | ICD-10-CM | POA: Insufficient documentation

## 2018-08-18 DIAGNOSIS — S99922A Unspecified injury of left foot, initial encounter: Secondary | ICD-10-CM | POA: Diagnosis present

## 2018-08-18 DIAGNOSIS — I10 Essential (primary) hypertension: Secondary | ICD-10-CM | POA: Diagnosis not present

## 2018-08-18 DIAGNOSIS — Y999 Unspecified external cause status: Secondary | ICD-10-CM | POA: Diagnosis not present

## 2018-08-18 DIAGNOSIS — Y929 Unspecified place or not applicable: Secondary | ICD-10-CM | POA: Insufficient documentation

## 2018-08-18 MED ORDER — KETOROLAC TROMETHAMINE 30 MG/ML IJ SOLN
60.0000 mg | Freq: Once | INTRAMUSCULAR | Status: AC
  Administered 2018-08-18: 60 mg via INTRAMUSCULAR
  Filled 2018-08-18: qty 2

## 2018-08-18 NOTE — ED Triage Notes (Signed)
Pain to lt foot after hearing a pop last night when she was running

## 2018-08-18 NOTE — Discharge Instructions (Addendum)
Apply ice packs on and off to your foot.  Minimal weightbearing.  Wear the cam walker for standing or walking.  Ibuprofen 600 to 800 mg 3 times a day with food.  Call Dr. Mort Sawyers office tomorrow to arrange a follow-up appointment.

## 2018-08-18 NOTE — ED Provider Notes (Signed)
[facility] EMERGENCY DEPARTMENT Provider Note   CSN: [id number] Arrival date & time: 08/18/18  1846    History   Chief Complaint Chief Complaint  Patient presents with  . Foot Injury    HPI Judy Harrison is a 53 y.o. female.     HPI   Judy Harrison is a 53 y.o. female who presents to the Emergency Department complaining of pain along her left Achilles tendon.  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  She states that she was running down steps last evening when she heard a "pop" along her left heel and when she tried to step off the ball of her left foot she had severe sharp pain that radiated along the distal end of the Achilles tendon.  She is tried applying ice and elevating her foot today, but continues to have increasing pain with weightbearing.  She also complains of mild swelling along the back of her heel.  She denies numbness of her foot, pain radiating into the proximal calf, and knee pain.  She denies fall.    Past Medical History:  Diagnosis Date  . Allergy to alpha-gal   . Anaphylaxis    Onions  . Epidermal cyst 02/08/2014  . Hypertension   . Kidney stone   . Peri-menopausal 02/08/2014  . Supraventricular tachycardia (HCC)   . UTI (lower urinary tract infection) 02/08/2014    Patient Active Problem List   Diagnosis Date Noted  . Screening for colorectal cancer 11/22/2017  . Encounter for gynecological examination with Papanicolaou smear of cervix 11/22/2017  . Elevated BP without diagnosis of hypertension 11/22/2017  . UTI (lower urinary tract infection) 02/08/2014  . Epidermal cyst 02/08/2014  . Peri-menopausal 02/08/2014  . PALPITATIONS 07/03/2010  . 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 THRUSH 04/26/2008  . PNEUMONIA 04/26/2008  . PYELONEPHRITIS 04/26/2008  . NEPHROLITHIASIS 04/26/2008  . BACK PAIN, CHRONIC 04/26/2008  . RESPIRATORY DISTRESS 04/26/2008  . SEPSIS 04/26/2008    Past Surgical History:  Procedure Laterality Date  .  CESAREAN SECTION    . ENDOMETRIAL ABLATION    . stent in kidney    . TUBAL LIGATION    . uterine abla       OB History    Gravida  4   Para  3   Term      Preterm      AB  1   Living  3     SAB  1   TAB      Ectopic      Multiple      Live Births               Home Medications    Prior to Admission medications   Medication Sig Start Date End Date Taking? Authorizing Provider  albuterol (PROVENTIL HFA;VENTOLIN HFA) 108 (90 Base) MCG/ACT inhaler Inhale 2 puffs into the lungs every 6 (six) hours as needed for wheezing or shortness of breath. 07/25/18   Jeannine Boga, NP  amoxicillin-clavulanate (AUGMENTIN) 875-125 MG tablet Take 1 tablet by mouth 2 (two) times daily. 07/25/18   Jeannine Boga, NP  aspirin 81 MG tablet Take 81 mg by mouth daily.    [provider]  diphenhydrAMINE (BENADRYL) 25 MG tablet Take 1 tablet (25 mg total) by mouth every 6 (six) hours. 11/14/14   Azalia Bilis, MD  hydrochlorothiazide (MICROZIDE) 12.5 MG capsule TAKE 1 CAPSULE BY MOUTH DAILY 08/08/18   Antoine Poche, MD  ibuprofen (ADVIL,MOTRIN) 800  MG tablet Take 800 mg by mouth as needed.    [provider]  metoprolol tartrate (LOPRESSOR) 25 MG tablet Take 1 tablet (25 mg total) by mouth 2 (two) times daily. (BETA BLOCKER) SCHEDULE OV FOR FURTHER REFILLS. 07/31/18   Antoine Poche, MD    Family History Family History  Problem Relation Age of Onset  . Cancer Mother   . Hypertension Mother   . Hypertension Father   . Hyperlipidemia Father   . Hyperlipidemia Brother   . Anxiety disorder Daughter   . Other Son        lymphedema  . Hypertension Maternal Grandmother   . Other Maternal Grandmother        CVA  . Alzheimer's disease Maternal Grandmother   . Cancer Maternal Grandfather        liver, lung  . Other Paternal Grandmother        CVA  . Alzheimer's disease Paternal Grandmother   . Heart disease Paternal Grandfather   . Heart attack Paternal  Grandfather   . Other Son        lung contusion; liver lac    Social History Social History   Tobacco Use  . Smoking status: Never Smoker  . Smokeless tobacco: Never Used  Substance Use Topics  . Alcohol use: Yes    Comment: occcasional  . Drug use: No     Allergies   Beef-derived products; Onion; Pork-derived products; and Sulfonamide derivatives   Review of Systems Review of Systems  Constitutional: Negative for chills and fever.  Musculoskeletal: Positive for arthralgias (Left foot pain and swelling). Negative for joint swelling.  Skin: Negative for color change and wound.  Neurological: Negative for weakness and numbness.     Physical Exam Updated Vital Signs BP (!) 165/76 (BP Location: Right Arm)   Pulse 66   Temp 98.3 F (36.8 C) (Oral)   Resp 20   Ht  (1.575 m)   Wt 76.2 kg   SpO2 100%   BMI 30.73 kg/m   Physical Exam Vitals signs and nursing note reviewed.  Constitutional:      Appearance: Normal appearance.  Cardiovascular:     Rate and Rhythm: Normal rate and regular rhythm.     Pulses: Normal pulses.  Pulmonary:     Effort: Pulmonary effort is normal.     Breath sounds: Normal breath sounds.  Musculoskeletal:        General: Tenderness and signs of injury present. No swelling.     Left foot: Tenderness present. No bony tenderness, crepitus or deformity.       Feet:     Comments: Focal tenderness at the posterior left heel and extending to the distal Achilles tendon.  Mild swelling is noted distally.  The Achilles tendon appears intact.  Negative Thompson sign.  Left ankle is nontender.  No tenderness of the lateral left foot.  Skin:    General: Skin is warm.     Capillary Refill: Capillary refill takes less than 2 seconds.     Findings: No erythema.  Neurological:     General: No focal deficit present.     Mental Status: She is alert.      ED Treatments / Results  Labs (all labs ordered are listed, but only abnormal results are  displayed) Labs Reviewed - No data to display  EKG None  Radiology Dg Foot Complete Left  Result Date: 08/18/2018 CLINICAL DATA:  Injury with pain EXAM: LEFT FOOT - COMPLETE 3+  VIEW COMPARISON:  01/23/2010 FINDINGS: There is no evidence of fracture or dislocation. There is no evidence of arthropathy or other focal bone abnormality. Soft tissues are unremarkable. Small plantar calcaneal spur. Stable sclerotic focus in the first metatarsal, felt benign. IMPRESSION: No acute osseous abnormality Electronically Signed   By: Jasmine Pang M.D.   On: 08/18/2018 20:20    Procedures Procedures (including critical care time)  Medications Ordered in ED Medications  ketorolac (TORADOL) 30 MG/ML injection 60 mg (60 mg Intramuscular Given 08/18/18 1924)     Initial Impression / Assessment and Plan / ED Course  I have reviewed the triage vital signs and the nursing notes.  Pertinent labs & imaging results that were available during my care of the patient were reviewed by me and considered in my medical decision making (see chart for details).        Patient with likely partial tear of the Achilles tendon.  I do not feel there is a complete rupture.  She has a negative Thompson sign and the proximal calf is nontender and without edema.  Extremity is neurovascularly intact.  No bony tenderness of the left foot or ankle.  Patient placed in a cam walker boot.  She prefers orthopedic follow-up with Dr. Romeo Apple.  I feel she is appropriate for discharge home and she agrees to elevate, ice, and ibuprofen for pain.  Final Clinical Impressions(s) / ED Diagnoses   Final diagnoses:  Injury of left Achilles tendon, initial encounter    ED Discharge Orders    None       Rosey Bath 08/18/18 2129    Eber Hong, MD 08/19/18 0010

## 2018-08-19 ENCOUNTER — Telehealth: Payer: Self-pay | Admitting: Orthopedic Surgery

## 2018-08-19 NOTE — Telephone Encounter (Signed)
Patient called, voice message received this afternoon, and I returned call to patient, who relays that her injury occurred at work, at [employer]. It is a right achilles tendon injury, for which she was treated at [facility] emergency room. Discussed workers comp protocol, of which patient is aware. She had reported to her supervisor last night when it occurred.  Awaiting approval from Health at work/employee health. Aware we are happy to schedule if appropriate worker's comp approval/authorization has been received.

## 2018-08-25 ENCOUNTER — Ambulatory Visit (INDEPENDENT_AMBULATORY_CARE_PROVIDER_SITE_OTHER): Payer: Worker's Compensation | Admitting: Orthopedic Surgery

## 2018-08-25 ENCOUNTER — Encounter: Payer: Self-pay | Admitting: Orthopedic Surgery

## 2018-08-25 ENCOUNTER — Other Ambulatory Visit: Payer: Self-pay

## 2018-08-25 VITALS — BP 139/100 | HR 65 | Ht 62.5 in | Wt 168.0 lb

## 2018-08-25 DIAGNOSIS — S86012A Strain of left Achilles tendon, initial encounter: Secondary | ICD-10-CM | POA: Diagnosis not present

## 2018-08-25 DIAGNOSIS — S96812A Strain of other specified muscles and tendons at ankle and foot level, left foot, initial encounter: Secondary | ICD-10-CM

## 2018-08-25 NOTE — Progress Notes (Signed)
NEW PATIENT OFFICE VISIT  Chief Complaint  Patient presents with  . Tendonitis    ER follow up on left achilles, DOI 08-17-18. Worker's comp.    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  It is a dull ache partially relieved with ibuprofen slightly better but she still has significant pain weightbearing.   Review of Systems  Respiratory: Negative for shortness of breath.   Cardiovascular: Negative for chest pain.  Musculoskeletal: Positive for joint pain. Negative for falls.  Neurological: Positive for tingling and focal weakness.     Past Medical History:  Diagnosis Date  . Allergy to alpha-gal   . Anaphylaxis    Onions  . Epidermal cyst 02/08/2014  . Hypertension   . Kidney stone   . Peri-menopausal 02/08/2014  . Supraventricular tachycardia (HCC)   . UTI (lower urinary tract infection) 02/08/2014    Past Surgical History:  Procedure Laterality Date  . CESAREAN SECTION    . ENDOMETRIAL ABLATION    . stent in kidney    . TUBAL LIGATION    . uterine abla      Family History  Problem Relation Age of Onset  . Cancer Mother   . Hypertension Mother   . Hypertension Father   . Hyperlipidemia Father   . Hyperlipidemia Brother   . Anxiety disorder Daughter   . Other Son        lymphedema  . Hypertension Maternal Grandmother   . Other Maternal Grandmother        CVA  . Alzheimer's disease Maternal Grandmother   . Cancer Maternal Grandfather        liver, lung  . Other Paternal Grandmother        CVA  . Alzheimer's disease Paternal Grandmother   . Heart disease Paternal Grandfather   . Heart  attack Paternal Grandfather   . Other Son        lung contusion; liver lac   Social History   Tobacco Use  . Smoking status: Never Smoker  . Smokeless tobacco: Never Used  Substance Use Topics  . Alcohol use: Yes    Comment: occcasional  . Drug use: No    Allergies  Allergen Reactions  . Beef-Derived Products Hives  . Onion   . Pork-Derived Products   . Sulfonamide Derivatives     REACTION: hives, swelling, rash    No outpatient medications have been marked as taking for the 08/25/18 encounter (Office Visit) with Vickki Hearing, MD.    BP (!) 139/100   Pulse 65   Ht 5' 2.5" (1.588 m)   Wt 168 lb (76.2 kg)   BMI 30.24 kg/m   Physical Exam Vitals signs and nursing note reviewed.  Constitutional:      Appearance: Normal appearance.  Neurological:     Mental Status: She is alert and oriented to person, place, and time.  Psychiatric:        Mood and Affect: Mood normal.     Ortho Exam  Right ankle comparison examination inspection revealed no abnormalities in the Achilles tendon  she had full range of motion her ankle joint was stable excellent plantarflexion strength there were no skin bruises or ecchymosis pulse and perfusion were normal temperature normal there were no sensory deficits  On the left side she has swelling and tenderness in the substance of the Achilles tendon which is tender to palpation but intact throughout its course.  She has tenderness of the medial side of the lower leg with a normal Thompson test painful dorsiflexion weak plantar flexion.  Ankle is stable.  The skin is warm dry and intact there is no ecchymosis pulse and perfusion are normal and sensation is normal.  MEDICAL DECISION SECTION  Xrays were done at X-ray was done at [facility] on 9 March no fracture dislocation was seen soft tissues were unremarkable  My independent reading of xrays:  I saw the x-rays I agree there is no acute process  Encounter Diagnoses  Name  Primary?  . Rupture of left plantaris tendon, initial encounter   . Partial tear of left Achilles tendon, initial encounter Yes    PLAN: (Rx., injectx, surgery, frx, mri/ct) Recommend ibuprofen Nonweightbearing with a knee walker Out of work 3 weeks After 3 weeks which will be 4 weeks post injury consider weightbearing as tolerated  No orders of the defined types were placed in this encounter.   Fuller Canada, MD  08/25/2018 11:20 AM

## 2018-08-25 NOTE — Patient Instructions (Signed)
OOW 3 WEEKS   NO WB X 3 WEEKS AND RE-ASSESS   WEAR CAM WALKER   NEEDS KNEE WALKER   IBUPROFEN 800 TID AS NEEDED

## 2018-08-27 ENCOUNTER — Telehealth: Payer: Self-pay | Admitting: Orthopedic Surgery

## 2018-08-27 NOTE — Telephone Encounter (Signed)
ok 

## 2018-08-27 NOTE — Telephone Encounter (Signed)
Patient called, and letter has been received via fax as well, relaying that her Worker's comp claim for left foot, has been denied. Her initial visit here was 08/25/18, at which time, she was taken out of work. 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 Please advise.

## 2018-08-28 ENCOUNTER — Telehealth: Payer: Self-pay | Admitting: Orthopedic Surgery

## 2018-08-28 ENCOUNTER — Encounter: Payer: Self-pay | Admitting: Orthopedic Surgery

## 2018-08-28 NOTE — Telephone Encounter (Signed)
Faxed updated work note (release for light duty only) as noted to [employer] / workers comp attn [employer], fax# [phone]; patient picked up copy.

## 2018-08-28 NOTE — Telephone Encounter (Signed)
Called back to patient; notified. Work note issued accordingly.

## 2018-09-15 ENCOUNTER — Encounter: Payer: Self-pay | Admitting: Orthopedic Surgery

## 2018-09-15 ENCOUNTER — Ambulatory Visit (INDEPENDENT_AMBULATORY_CARE_PROVIDER_SITE_OTHER): Payer: No Typology Code available for payment source | Admitting: Orthopedic Surgery

## 2018-09-15 ENCOUNTER — Other Ambulatory Visit: Payer: Self-pay

## 2018-09-15 VITALS — BP 150/108 | HR 99 | Ht 62.5 in | Wt 172.0 lb

## 2018-09-15 DIAGNOSIS — S96812D Strain of other specified muscles and tendons at ankle and foot level, left foot, subsequent encounter: Secondary | ICD-10-CM

## 2018-09-15 DIAGNOSIS — S96812A Strain of other specified muscles and tendons at ankle and foot level, left foot, initial encounter: Secondary | ICD-10-CM | POA: Insufficient documentation

## 2018-09-15 DIAGNOSIS — S86012D Strain of left Achilles tendon, subsequent encounter: Secondary | ICD-10-CM

## 2018-09-15 DIAGNOSIS — S86012A Strain of left Achilles tendon, initial encounter: Secondary | ICD-10-CM | POA: Insufficient documentation

## 2018-09-15 NOTE — Patient Instructions (Signed)
Work Note: Press photographer duty x 3 weeks  See the doctor in 3 week  Continue Cam Walker and knee scooter

## 2018-09-15 NOTE — Progress Notes (Signed)
Chief Complaint  Patient presents with  . 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  It is a dull ache partially relieved with ibuprofen slightly better but she still has significant pain weightbearing.  Review of Systems  Musculoskeletal:       Knee walker occasionally causing knee and hip pain after 1 hour or so walking with that   BP (!) 150/108   Pulse 99   Ht 5' 2.5" (1.588 m)   Wt 172 lb (78 kg)   BMI 30.96 kg/m   Physical Exam Vitals signs and nursing note reviewed.  Constitutional:      Appearance: Normal appearance.  Neurological:     Mental Status: She is alert and oriented to person, place, and time.  Psychiatric:        Mood and Affect: Mood normal.    The patient is ambulatory with a heel toe gait with Achilles avoidance gait  She has a tender nodule on the Achilles from the acute partial rupture mild tenderness along the plantaris muscle  With normal Thompson test  Weak plantar flexion  Normal passive range of motion  Neurovascular exam normal  Skin is intact  Encounter Diagnoses  Name Primary?  . Rupture of left plantaris tendon, subsequent encounter Yes  . Partial tear of left Achilles tendon, subsequent  encounter     Recommend continue CAM Walker  Continue administrative duty for 3 weeks  Follow-up 3 weeks in person

## 2018-09-25 ENCOUNTER — Telehealth: Payer: Self-pay | Admitting: *Deleted

## 2018-09-25 NOTE — Telephone Encounter (Signed)
   Cardiac Questionnaire:    Since your last visit or hospitalization:    1. Have you been having new or worsening chest pain? No   2. Have you been having new or worsening shortness of breath? No 3. Have you been having new or worsening leg swelling, wt gain, or increase in abdominal girth (pants fitting more tightly)? Yes   4. Have you had any passing out spells? No    *A YES to any of these questions would result in the appointment being kept. *If all the answers to these questions are NO, we should indicate that given the current situation regarding the worldwide coronarvirus pandemic, at the recommendation of the CDC, we are looking to limit gatherings in our waiting area, and thus will reschedule their appointment beyond four weeks from today.   _____________

## 2018-10-01 ENCOUNTER — Telehealth: Payer: Self-pay | Admitting: Cardiology

## 2018-10-01 NOTE — Telephone Encounter (Signed)
Virtual Visit Pre-Appointment Phone Call  "(Name), I am calling you today to discuss your upcoming appointment. We are currently trying to limit exposure to the virus that causes COVID-19 by seeing patients at home rather than in the office."  1. "What is the BEST phone number to call the day of the visit?" - include this in appointment notes [phone] 2.   3. Do you have or have access to (through a family member/friend) a smartphone with video capability that we can use for your visit?" a. If yes - list this number in appt notes as cell (if different from BEST phone #) and list the appointment type as a VIDEO visit in appointment notes b. If no - list the appointment type as a PHONE visit in appointment notes  4. Confirm consent - "In the setting of the current Covid19 crisis, you are scheduled for a (phone or video) visit with your provider on (date) at (time).  Just as we do with many in-office visits, in order for you to participate in this visit, we must obtain consent.  If you'd like, I can send this to your mychart (if signed up) or email for you to review.  Otherwise, I can obtain your verbal consent now.  All virtual visits are billed to your insurance company just like a normal visit would be.  By agreeing to a virtual visit, we'd like you to understand that the technology does not allow for your provider to perform an examination, and thus may limit your provider's ability to fully assess your condition. If your provider identifies any concerns that need to be evaluated in person, we will make arrangements to do so.  Finally, though the technology is pretty good, we cannot assure that it will always work on either your or our end, and in the setting of a video visit, we may have to convert it to a phone-only visit.  In either situation, we cannot ensure that we have a secure connection.  Are you willing to proceed?" STAFF: Did the patient verbally acknowledge consent to telehealth  visit? Document YES/NO here: YES 5.   6. Advise patient to be prepared - "Two hours prior to your appointment, go ahead and check your blood pressure, pulse, oxygen saturation, and your weight (if you have the equipment to check those) and write them all down. When your visit starts, your provider will ask you for this information. If you have an Apple Watch or Kardia device, please plan to have heart rate information ready on the day of your appointment. Please have a pen and paper handy nearby the day of the visit as well."  7. Give patient instructions for MyChart download to smartphone OR Doximity/Doxy.me as below if video visit (depending on what platform provider is using)  8. Inform patient they will receive a phone call 15 minutes prior to their appointment time (may be from unknown caller ID) so they should be prepared to answer    TELEPHONE CALL NOTE  Judy Harrison has been deemed a candidate for a follow-up tele-health visit to limit community exposure during the Covid-19 pandemic. I spoke with the patient via phone to ensure availability of phone/video source, confirm preferred email & phone number, and discuss instructions and expectations.  I reminded Judy Harrison to be prepared with any vital sign and/or heart rhythm information that could potentially be obtained via home monitoring, at the time of her visit. I reminded Judy Harrison to expect  a phone call prior to her visit.  Megan Salon 10/01/2018 3:18 PM   INSTRUCTIONS FOR DOWNLOADING THE MYCHART APP TO SMARTPHONE  - The patient must first make sure to have activated MyChart and know their login information - If Apple, go to [employer] and type in MyChart in the search bar and download the app. If Android, ask patient to go to [employer] and type in [place] in the search bar and download the app. The app is free but as with any other app downloads, their phone may require them to verify saved payment information or  Apple/Android password.  - The patient will need to then log into the app with their MyChart username and password, and select [facility] as their healthcare provider to link the account. When it is time for your visit, go to the MyChart app, find appointments, and click Begin Video Visit. Be sure to Select Allow for your device to access the Microphone and Camera for your visit. You will then be connected, and your provider will be with you shortly.  **If they have any issues connecting, or need assistance please contact MyChart service desk [phone])**  **If using a computer, in order to ensure the best quality for their visit they will need to use either of the following Internet Browsers: [employer] or Google Chrome**  IF USING DOXIMITY or DOXY.ME - The patient will receive a link just prior to their visit by text.     FULL LENGTH CONSENT FOR TELE-HEALTH VISIT   I hereby voluntarily request, consent and authorize [facility] and its employed or contracted physicians, physician assistants, nurse practitioners or other licensed health care professionals (the Practitioner), to provide me with telemedicine health care services (the Services") as deemed necessary by the treating Practitioner. I acknowledge and consent to receive the Services by the Practitioner via telemedicine. I understand that the telemedicine visit will involve communicating with the Practitioner through live audiovisual communication technology and the disclosure of certain medical information by electronic transmission. I acknowledge that I have been given the opportunity to request an in-person assessment or other available alternative prior to the telemedicine visit and am voluntarily participating in the telemedicine visit.  I understand that I have the right to withhold or withdraw my consent to the use of telemedicine in the course of my care at any time, without affecting my right to future care  or treatment, and that the Practitioner or I may terminate the telemedicine visit at any time. I understand that I have the right to inspect all information obtained and/or recorded in the course of the telemedicine visit and may receive copies of available information for a reasonable fee.  I understand that some of the potential risks of receiving the Services via telemedicine include:   Delay or interruption in medical evaluation due to technological equipment failure or disruption;  Information transmitted may not be sufficient (e.g. poor resolution of images) to allow for appropriate medical decision making by the Practitioner; and/or   In rare instances, security protocols could fail, causing a breach of personal health information.  Furthermore, I acknowledge that it is my responsibility to provide information about my medical history, conditions and care that is complete and accurate to the best of my ability. I acknowledge that Practitioner's advice, recommendations, and/or decision may be based on factors not within their control, such as incomplete or inaccurate data provided by me or distortions of diagnostic images or specimens that may  result from electronic transmissions. I understand that the practice of medicine is not an exact science and that Practitioner makes no warranties or guarantees regarding treatment outcomes. I acknowledge that I will receive a copy of this consent concurrently upon execution via email to the email address I last provided but may also request a printed copy by calling the office of [facility].    I understand that my insurance will be billed for this visit.   I have read or had this consent read to me.  I understand the contents of this consent, which adequately explains the benefits and risks of the Services being provided via telemedicine.   I have been provided ample opportunity to ask questions regarding this consent and the Services and have had  my questions answered to my satisfaction.  I give my informed consent for the services to be provided through the use of telemedicine in my medical care  By participating in this telemedicine visit I agree to the above.

## 2018-10-02 ENCOUNTER — Encounter: Payer: Self-pay | Admitting: Cardiology

## 2018-10-02 ENCOUNTER — Telehealth (INDEPENDENT_AMBULATORY_CARE_PROVIDER_SITE_OTHER): Payer: PRIVATE HEALTH INSURANCE | Admitting: Cardiology

## 2018-10-02 VITALS — BP 140/92 | HR 74 | Ht 62.5 in | Wt 167.0 lb

## 2018-10-02 DIAGNOSIS — R002 Palpitations: Secondary | ICD-10-CM

## 2018-10-02 DIAGNOSIS — I10 Essential (primary) hypertension: Secondary | ICD-10-CM

## 2018-10-02 MED ORDER — METOPROLOL TARTRATE 25 MG PO TABS: 25.0000 mg | ORAL_TABLET | Freq: Two times a day (BID) | ORAL | 3 refills | Status: DC

## 2018-10-02 MED FILL — METOPROLOL TARTRATE 25 MG T: 25 | 90 days supply | Qty: 180 | Fill #0

## 2018-10-02 MED FILL — HYDROCHLOROTHIAZIDE 12.5 MG: 12.5 | 90 days supply | Qty: 90 | Fill #0

## 2018-10-02 NOTE — Progress Notes (Signed)
Virtual Visit via Telephone Note   This visit type was conducted due to national recommendations for restrictions regarding the COVID-19 Pandemic (e.g. social distancing) in an effort to limit this patient's exposure and mitigate transmission in our community.  Due to her co-morbid illnesses, this patient is at least at moderate risk for complications without adequate follow up.  This format is felt to be most appropriate for this patient at this time.  The patient did not have access to video technology/had technical difficulties with video requiring transitioning to audio format only (telephone).  All issues noted in this document were discussed and addressed.  No physical exam could be performed with this format.  Please refer to the patient's chart for her  consent to [facility].   Evaluation Performed:  Follow-up visit  Date:  10/02/2018   ID:  Judy Harrison, DOB 1965-10-17, MRN [id number]  Patient Location: Home Provider Location: Home  PCP:  Merlyn AlbertLuking, William S, MD  Cardiologist:  Dina RichBranch, Jamere Stidham, MD  Electrophysiologist:  None   Chief Complaint:  1 year follow up  History of Present Illness:    Judy Harrison is a 53 y.o. female seen today for follow up of the following medical problems.    1. Palpitations - mild palpitations at times. Improves with vagal maneuvers. Only 2-3 episodes. Increased stress with recent injury at work, dealing with workers comp etc.    2. HTN - during ortho visit earlier this month bp 150/108. In ER with ankle injury 165/76 - does not check at home. Checked at work 140/92.   3. Ankle injury - followed by ortho  The patient does not have symptoms concerning for COVID-19 infection (fever, chills, cough, or new shortness of breath).    Past Medical History:  Diagnosis Date  . Allergy to alpha-gal   . Anaphylaxis    Onions  . Epidermal cyst 02/08/2014  . Hypertension   . Kidney stone   . Peri-menopausal 02/08/2014  .  Supraventricular tachycardia (HCC)   . UTI (lower urinary tract infection) 02/08/2014   Past Surgical History:  Procedure Laterality Date  . CESAREAN SECTION    . ENDOMETRIAL ABLATION    . stent in kidney    . TUBAL LIGATION    . uterine abla       No outpatient medications have been marked as taking for the 10/02/18 encounter (Appointment) with Antoine PocheBranch, Nnamdi Dacus F, MD.     Allergies:   Beef-derived products; Onion; Pork-derived products; and Sulfonamide derivatives   Social History   Tobacco Use  . Smoking status: Never Smoker  . Smokeless tobacco: Never Used  Substance Use Topics  . Alcohol use: Yes    Comment: occcasional  . Drug use: No     Family Hx: The patient's family history includes Alzheimer's disease in her maternal grandmother and paternal grandmother; Anxiety disorder in her daughter; Cancer in her maternal grandfather and mother; Heart attack in her paternal grandfather; Heart disease in her paternal grandfather; Hyperlipidemia in her brother and father; Hypertension in her father, maternal grandmother, and mother; Other in her maternal grandmother, paternal grandmother, son, and son.  ROS:   Please see the history of present illness.     All other systems reviewed and are negative.   Prior CV studies:   The following studies were reviewed today:  03/2016 echo Study Conclusions  - Left ventricle: The cavity size was normal. Wall thickness was   increased in a pattern of mild  LVH. Systolic function was normal.   The estimated ejection fraction was in the range of 60% to 65%.   Wall motion was normal; there were no regional wall motion   abnormalities. Left ventricular diastolic function parameters   were normal. - Atrial septum: No defect or patent foramen ovale was identified.  Labs/Other Tests and Data Reviewed:    EKG:  na  Recent Labs: No results found for requested labs within last 8760 hours.   Recent Lipid Panel Lab Results  Component  Value Date/Time   CHOL 220 (H) 04/09/2014 11:15 AM   TRIG 215.0 (H) 04/09/2014 11:15 AM   HDL 35.40 (L) 04/09/2014 11:15 AM   CHOLHDL 6 04/09/2014 11:15 AM   LDLDIRECT 140.0 04/09/2014 11:15 AM    Wt Readings from Last 3 Encounters:  09/15/18 172 lb (78 kg)  08/25/18 168 lb (76.2 kg)  08/18/18 168 lb (76.2 kg)     Objective:    Vital Signs:  p 74 bp 140/92 Wt 167 lbs   Normal affect, normal speech pattern and tone. Comfortable in no distress. No auditory sounds of SOB or wheezing.   ASSESSMENT & PLAN:    1. Palpitations - mild symptoms at times, significant increase in stress due to recent work related injury - continue to monitor at this time, room to titrate lopressor if needed  2. HTN - some recent elevated bp's at other visits - she will report her bp's in 2 weeks, if above 130/80 will increase her HCTZ vs increase lopressor if worsening palpitatoins    COVID-19 Education: The signs and symptoms of COVID-19 were discussed with the patient and how to seek care for testing (follow up with PCP or arrange E-visit).  The importance of social distancing was discussed today.  Time:   Today, I have spent 20 minutes with the patient with telehealth technology discussing the above problems.     Medication Adjustments/Labs and Tests Ordered: Current medicines are reviewed at length with the patient today.  Concerns regarding medicines are outlined above.   Tests Ordered: No orders of the defined types were placed in this encounter.   Medication Changes: No orders of the defined types were placed in this encounter.   Disposition:  Follow up 1 year  Signed, Dina Rich, MD  10/02/2018 7:47 AM    [facility]

## 2018-10-02 NOTE — Progress Notes (Signed)
Medication Instructions:  Your physician recommends that you continue on your current medications as directed. Please refer to the Current Medication list given to you today.   Labwork: none  Testing/Procedures: none  Follow-Up: Your physician wants you to follow-up in: 1 year. You will receive a reminder letter in the mail two months in advance. If you don't receive a letter, please call our office to schedule the follow-up appointment.   Any Other Special Instructions Will Be Listed Below (If Applicable)  Please call office in 2 weeks and report blood pressure readings.   If you need a refill on your cardiac medications before your next appointment, please call your pharmacy.

## 2018-10-06 ENCOUNTER — Ambulatory Visit (INDEPENDENT_AMBULATORY_CARE_PROVIDER_SITE_OTHER): Payer: No Typology Code available for payment source | Admitting: Orthopedic Surgery

## 2018-10-06 ENCOUNTER — Encounter: Payer: Self-pay | Admitting: Orthopedic Surgery

## 2018-10-06 ENCOUNTER — Other Ambulatory Visit: Payer: Self-pay

## 2018-10-06 VITALS — BP 158/90 | HR 71 | Temp 98.3°F | Ht 62.5 in | Wt 167.0 lb

## 2018-10-06 DIAGNOSIS — S86012D Strain of left Achilles tendon, subsequent encounter: Secondary | ICD-10-CM

## 2018-10-06 NOTE — Progress Notes (Signed)
Chief Complaint  Patient presents with  . 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  On 8 March at work she was running to a code stepdown on the left ankle felt acute intense pain up the back of the ankle and foot and was seen in the emergency room diagnosed with a partial Achilles tendon injury placed in a cam walker and sent here for follow-up   She complains of pain on the medial side of her leg and in the Achilles tendon proper.  It is a dull ache partially relieved with ibuprofen slightly better but she still has significant pain weightbearing.  Anushree is improving but she cannot walk normally without pain and a limp  Physical Exam Vitals signs and nursing note reviewed.  Constitutional:      Appearance: Normal appearance.  Musculoskeletal:       Feet:  Neurological:     Mental Status: She is alert and oriented to person, place, and time.  Psychiatric:        Mood and Affect: Mood normal.    Encounter Diagnosis  Name Primary?  . Partial tear of left Achilles tendon, subsequent encounter Yes    Recommend continue CAM Walker with trials of walking in the house  Follow-up in 4 weeks  The patient could return to desk duty if available, is not ready for full-time unrestricted duty yet

## 2018-10-06 NOTE — Patient Instructions (Signed)
WORK NOTE:   MAY RETURN WITH RESTRICTIONS: DESK WORK ONLY   NOT READY FOR UNRESTRICTED DUTY YET

## 2018-11-07 ENCOUNTER — Other Ambulatory Visit: Payer: Self-pay

## 2018-11-07 ENCOUNTER — Ambulatory Visit: Payer: No Typology Code available for payment source | Admitting: Orthopedic Surgery

## 2018-11-07 ENCOUNTER — Encounter: Payer: Self-pay | Admitting: Orthopedic Surgery

## 2018-11-07 VITALS — BP 156/109 | HR 63 | Temp 97.5°F | Ht 62.5 in | Wt 167.0 lb

## 2018-11-07 DIAGNOSIS — S86012D Strain of left Achilles tendon, subsequent encounter: Secondary | ICD-10-CM | POA: Diagnosis not present

## 2018-11-07 NOTE — Patient Instructions (Signed)
Light duty x 4 more weeks  Wear 1/2 inch heel lift in sneaker at home   Ice  Ibuprofen

## 2018-11-07 NOTE — Progress Notes (Signed)
Chief Complaint  Patient presents with  . Ankle Pain    left achilles 08/17/18    53 year old female partial Achilles tendon injury currently in a cam walker on ibuprofen continue to make progress though not completely healed.  She can tolerate 30 minutes out of the boot without throbbing and aching pain  Review of systems negative on the musculoskeletal side  Examination shows a tender nodular area in the Achilles insertion in the watershed region with normal Thompson test slightly weak plantar flexion strength  Encounter Diagnosis  Name Primary?  . Partial tear of left Achilles tendon, subsequent encounter Yes    Recommend half inch heel lift for 4 weeks then quarter inch heel lift for 4 weeks then no heel lift  Start off at the house in a closed in shoe continue CAM Walker when out of the house and continue short-term 4-week limited duty

## 2018-11-10 ENCOUNTER — Telehealth: Payer: Self-pay | Admitting: Cardiology

## 2018-11-10 DIAGNOSIS — Z79899 Other long term (current) drug therapy: Secondary | ICD-10-CM

## 2018-11-10 NOTE — Telephone Encounter (Signed)
Patient called stating that she needs to report her BP readings:  10/27/2018   152/92 10/30/2018   150/96 11/01/2018   146/100 11/07/2018   156/109 Patient states that she is feeling very stressed at present time.    please call [phone]

## 2018-11-10 NOTE — Telephone Encounter (Signed)
Forward to Dr Branch 

## 2018-11-11 NOTE — Telephone Encounter (Signed)
[id number]-cc 

## 2018-11-11 NOTE — Telephone Encounter (Signed)
BPs too high, I think are best bet would be to change to a stronger diuretic called chlorthalidone but I don't see where she has had blood work in some time. Can we order her bmet/mg, once back likely change her HCTZ to chlorthalidone. How are her palpitations doing?   Dominga Ferry MD

## 2018-11-14 NOTE — Telephone Encounter (Signed)
I spoke with patient, will mail lab slips.She takes lopressor and has no palpitations

## 2018-11-14 NOTE — Telephone Encounter (Signed)
[id number]-cc 

## 2018-12-05 ENCOUNTER — Ambulatory Visit: Payer: No Typology Code available for payment source | Admitting: Orthopedic Surgery

## 2018-12-05 ENCOUNTER — Encounter: Payer: Self-pay | Admitting: Orthopedic Surgery

## 2018-12-05 ENCOUNTER — Other Ambulatory Visit: Payer: Self-pay

## 2018-12-05 VITALS — BP 141/83 | HR 56 | Temp 97.3°F | Ht 62.5 in | Wt 167.0 lb

## 2018-12-05 DIAGNOSIS — S86012D Strain of left Achilles tendon, subsequent encounter: Secondary | ICD-10-CM | POA: Diagnosis not present

## 2018-12-05 NOTE — Progress Notes (Signed)
Chief Complaint  Patient presents with  . Foot Pain    Lt  Achilles injury DOI 08/17/18   Encounter Diagnosis  Name Primary?  . Partial tear of left Achilles tendon, subsequent encounter Yes   Teja continues to make improvements she is now up to 2 hours in her shoe with the heel lift and using the cam walker primarily at work  Her [place] Silos test is now normal she does have some tenderness at the injury site.  Range of motion is returned to normal.  Recommend progressive increase time in the shoe with the heel lift and when she can wear the shoe with a heel lift for 8 hours then she can convert to a shorter heel lift  Light duty for 6 weeks f  FU 6 WEEKS

## 2018-12-05 NOTE — Patient Instructions (Signed)
WORK NOTE LIGHT DUTY FOR 6 WEEKS  RETURN TO REGULAR DUTY PROBABLY 6 WEEKS

## 2018-12-23 ENCOUNTER — Other Ambulatory Visit: Payer: Self-pay | Admitting: *Deleted

## 2018-12-23 MED ORDER — HYDROCHLOROTHIAZIDE 12.5 MG PO CAPS: 12.5000 mg | ORAL_CAPSULE | Freq: Every day | ORAL | 0 refills | Status: DC

## 2019-01-08 ENCOUNTER — Telehealth: Payer: Self-pay

## 2019-01-08 NOTE — Telephone Encounter (Signed)
Patient called asking if Dr. Aline Brochure could do a paper for her stating more specific on what she can do. Such as how long can she be up on her foot, states she is wearing the shoe with the wedge now and if she stands or walks more than a hour it starts to hurt. States they([employer]) are asking for more specific information so they can accommodate her at work.  She is into 21 weeks of her injury.

## 2019-01-08 NOTE — Telephone Encounter (Signed)
Tell her to write it out and I will sign it; there is no way for me to detail that for her.

## 2019-01-08 NOTE — Telephone Encounter (Signed)
Thanks. I called her to advise. She will come by tomorrow.

## 2019-01-13 ENCOUNTER — Other Ambulatory Visit: Payer: Self-pay | Admitting: *Deleted

## 2019-01-13 MED ORDER — HYDROCHLOROTHIAZIDE 12.5 MG PO CAPS: 12.5000 mg | ORAL_CAPSULE | Freq: Every day | ORAL | 0 refills | Status: DC

## 2019-01-13 MED FILL — HYDROCHLOROTHIAZIDE 12.5 MG: 12.5 | 30 days supply | Qty: 30 | Fill #0

## 2019-01-16 ENCOUNTER — Encounter: Payer: Self-pay | Admitting: Orthopedic Surgery

## 2019-01-16 ENCOUNTER — Ambulatory Visit: Payer: No Typology Code available for payment source | Admitting: Orthopedic Surgery

## 2019-01-16 ENCOUNTER — Other Ambulatory Visit: Payer: Self-pay

## 2019-01-16 VITALS — BP 162/98 | HR 61 | Temp 97.7°F | Ht 62.5 in | Wt 167.0 lb

## 2019-01-16 DIAGNOSIS — S86012D Strain of left Achilles tendon, subsequent encounter: Secondary | ICD-10-CM

## 2019-01-16 NOTE — Patient Instructions (Signed)
Continue heel lift ok to go to lower lift when comfortable

## 2019-01-16 NOTE — Progress Notes (Signed)
Chief Complaint  Patient presents with  . Ankle Injury    left Achilles 08/17/18   Routine follow-up left Achilles injury currently using 1/2 inch heel lift standing up to 8 hours per shift  She is tolerating 7-1/2 hours of standing and then becomes uncomfortable  She says it sore but getting better and stronger  Her Achilles tendon is nodular in the watershed region slightly tender but she has excellent plantarflexion strength  Her restrictions and on September 30 she will continue to wean herself out of the heel lift and I will see her in 6 weeks  Encounter Diagnosis  Name Primary?  . Partial tear of left Achilles tendon, subsequent encounter Yes

## 2019-02-08 ENCOUNTER — Encounter (HOSPITAL_COMMUNITY): Payer: Self-pay

## 2019-02-08 ENCOUNTER — Other Ambulatory Visit: Payer: Self-pay

## 2019-02-08 ENCOUNTER — Emergency Department (HOSPITAL_COMMUNITY)
Admission: EM | Admit: 2019-02-08 | Discharge: 2019-02-08 | Disposition: A | Discharge status: Home / Self Care | Payer: No Typology Code available for payment source | Attending: Emergency Medicine | Admitting: Emergency Medicine

## 2019-02-08 DIAGNOSIS — T7840XA Allergy, unspecified, initial encounter: Secondary | ICD-10-CM | POA: Diagnosis not present

## 2019-02-08 DIAGNOSIS — I10 Essential (primary) hypertension: Secondary | ICD-10-CM | POA: Diagnosis not present

## 2019-02-08 DIAGNOSIS — Z7982 Long term (current) use of aspirin: Secondary | ICD-10-CM | POA: Insufficient documentation

## 2019-02-08 DIAGNOSIS — Z79899 Other long term (current) drug therapy: Secondary | ICD-10-CM | POA: Insufficient documentation

## 2019-02-08 DIAGNOSIS — L299 Pruritus, unspecified: Secondary | ICD-10-CM | POA: Diagnosis present

## 2019-02-08 MED ORDER — FAMOTIDINE IN NACL 20-0.9 MG/50ML-% IV SOLN
20.0000 mg | Freq: Once | INTRAVENOUS | Status: AC
  Administered 2019-02-08: 20 mg via INTRAVENOUS
  Filled 2019-02-08: qty 50

## 2019-02-08 MED ORDER — EPINEPHRINE 0.3 MG/0.3ML IJ SOAJ: 0.3000 mg | Freq: Once | INTRAMUSCULAR | 1 refills | Status: AC

## 2019-02-08 MED ORDER — PREDNISONE 20 MG PO TABS
40.0000 mg | ORAL_TABLET | Freq: Once | ORAL | Status: AC
  Administered 2019-02-08: 40 mg via ORAL
  Filled 2019-02-08: qty 2

## 2019-02-08 MED ORDER — ALBUTEROL SULFATE HFA 108 (90 BASE) MCG/ACT IN AERS
2.0000 | INHALATION_SPRAY | RESPIRATORY_TRACT | Status: DC
  Administered 2019-02-08 (×2): 2 via RESPIRATORY_TRACT
  Filled 2019-02-08: qty 6.7

## 2019-02-08 MED ORDER — METHYLPREDNISOLONE SODIUM SUCC 125 MG IJ SOLR
125.0000 mg | Freq: Once | INTRAMUSCULAR | Status: AC
  Administered 2019-02-08: 125 mg via INTRAVENOUS
  Filled 2019-02-08: qty 2

## 2019-02-08 MED ORDER — SODIUM CHLORIDE 0.9 % IV BOLUS
500.0000 mL | Freq: Once | INTRAVENOUS | Status: AC
  Administered 2019-02-08: 500 mL via INTRAVENOUS

## 2019-02-08 MED ORDER — PREDNISONE 10 MG PO TABS: 10.0000 mg | ORAL_TABLET | Freq: Every day | ORAL | 1 refills | Status: DC

## 2019-02-08 MED ORDER — DIPHENHYDRAMINE HCL 50 MG/ML IJ SOLN
25.0000 mg | Freq: Once | INTRAMUSCULAR | Status: AC
  Administered 2019-02-08: 25 mg via INTRAVENOUS
  Filled 2019-02-08: qty 1

## 2019-02-08 NOTE — ED Triage Notes (Signed)
Pt reports has alpha gal and this morning ate a chicken and cheese biscuit from a restaurant.  Reports approx 6 hours after eating pt woke up with face, palms of hands, and bottoms of feet itching.  Then pt started coughing and felt like throat was tight.  Pt took 75mg  benadryl po at 1544 then took epi pen at 1551.  Rash noted to Pt's skin red, sob, wheezing initially.   Denies any pain.

## 2019-02-08 NOTE — ED Provider Notes (Addendum)
[facility] EMERGENCY DEPARTMENT Provider Note   CSN: [id number] Arrival date & time: 02/08/19  1654     History   Chief Complaint Chief Complaint  Patient presents with  . Allergic Reaction    HPI Judy Harrison is a 53 y.o. female.     Patient with known alpha gal allergy presents with generalized pruritus, erythema, throat tightness, wheezing approximately 6 hours after eating a chicken cheese biscuit this morning.  She took 75 mg of Benadryl and EpiPen just prior to ED visit.  She is feeling slightly better at this time.  No airway compromise.     Past Medical History:  Diagnosis Date  . Allergy to alpha-gal   . Anaphylaxis    Onions  . Epidermal cyst 02/08/2014  . Hypertension   . Kidney stone   . Peri-menopausal 02/08/2014  . Supraventricular tachycardia ([place])   . UTI (lower urinary tract infection) 02/08/2014    Patient Active Problem List   Diagnosis Date Noted  . Rupture of left plantaris tendon 09/15/2018  . Partial tear of left Achilles tendon 09/15/2018  . Screening for colorectal cancer 11/22/2017  . Encounter for gynecological examination with Papanicolaou smear of cervix 11/22/2017  . Elevated BP without diagnosis of hypertension 11/22/2017  . UTI (lower urinary tract infection) 02/08/2014  . Epidermal cyst 02/08/2014  . Peri-menopausal 02/08/2014  . PALPITATIONS 07/03/2010  . 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 THRUSH 04/26/2008  . PNEUMONIA 04/26/2008  . PYELONEPHRITIS 04/26/2008  . NEPHROLITHIASIS 04/26/2008  . BACK PAIN, CHRONIC 04/26/2008  . RESPIRATORY DISTRESS 04/26/2008  . SEPSIS 04/26/2008    Past Surgical History:  Procedure Laterality Date  . CESAREAN SECTION    . ENDOMETRIAL ABLATION    . stent in kidney    . TUBAL LIGATION    . uterine abla       OB History    Gravida  4   Para  3   Term      Preterm      AB  1   Living  3     SAB  1   TAB      Ectopic      Multiple      Live Births               Home Medications    Prior to Admission medications   Medication Sig Start Date End Date Taking? Authorizing Provider  aspirin 81 MG tablet Take 81 mg by mouth daily.   Yes [provider]  cetirizine (ZYRTEC) 10 MG tablet Take 10 mg by mouth daily as needed for allergies.    Yes [provider]  diphenhydrAMINE (BENADRYL) 25 MG tablet Take 1 tablet (25 mg total) by mouth every 6 (six) hours. Patient taking differently: Take 75 mg by mouth once.  11/14/14  Yes Jola Schmidt, MD  EPINEPHrine 0.3 mg/0.3 mL IJ SOAJ injection Inject 0.3 mg into the muscle once.    Yes [provider]  hydrochlorothiazide (MICROZIDE) 12.5 MG capsule Take 1 capsule (12.5 mg total) by mouth daily. 01/13/19  Yes BranchAlphonse Guild, MD  ibuprofen (ADVIL,MOTRIN) 800 MG tablet Take 800 mg by mouth daily as needed for mild pain or moderate pain.    Yes [provider]  metoprolol tartrate (LOPRESSOR) 25 MG tablet Take 1 tablet (25 mg total) by mouth 2 (two) times daily. (BETA BLOCKER) SCHEDULE OV FOR FURTHER REFILLS. 10/02/18  Yes Arnoldo Lenis, MD  EPINEPHrine (EPIPEN 2-PAK) 0.3 mg/0.3  mL IJ SOAJ injection Inject 0.3 mLs (0.3 mg total) into the muscle once for 1 dose. 02/08/19 02/08/19  Donnetta Hutching, MD  predniSONE (DELTASONE) 10 MG tablet Take 1 tablet (10 mg total) by mouth daily with breakfast. 02/08/19   Donnetta Hutching, MD    Family History Family History  Problem Relation Age of Onset  . Cancer Mother   . Hypertension Mother   . Hypertension Father   . Hyperlipidemia Father   . Hyperlipidemia Brother   . Anxiety disorder Daughter   . Other Son        lymphedema  . Hypertension Maternal Grandmother   . Other Maternal Grandmother        CVA  . Alzheimer's disease Maternal Grandmother   . Cancer Maternal Grandfather        liver, lung  . Other Paternal Grandmother        CVA  . Alzheimer's disease Paternal Grandmother   . Heart disease Paternal Grandfather   . Heart attack  Paternal Grandfather   . Other Son        lung contusion; liver lac    Social History Social History   Tobacco Use  . Smoking status: Never Smoker  . Smokeless tobacco: Never Used  Substance Use Topics  . Alcohol use: Yes    Comment: occcasional  . Drug use: No     Allergies   Onion, Pork-derived products, Beef-derived products, and Sulfonamide derivatives   Review of Systems Review of Systems  All other systems reviewed and are negative.    Physical Exam Updated Vital Signs BP 134/77   Pulse 88   Resp 17   Ht 5\' 2"  (1.575 m)   Wt 76.2 kg   SpO2 94%   BMI 30.73 kg/m   Physical Exam Vitals signs and nursing note reviewed.  Constitutional:      Appearance: She is well-developed.     Comments: Some wheezing.  HENT:     Head: Normocephalic and atraumatic.  Eyes:     Conjunctiva/sclera: Conjunctivae normal.  Neck:     Musculoskeletal: Neck supple.  Cardiovascular:     Rate and Rhythm: Normal rate and regular rhythm.  Pulmonary:     Effort: Pulmonary effort is normal.     Breath sounds: Normal breath sounds.  Abdominal:     General: Bowel sounds are normal.     Palpations: Abdomen is soft.  Musculoskeletal: Normal range of motion.  Skin:    General: Skin is warm and dry.     Comments: Generalized erythematous urticarial rash  Neurological:     Mental Status: She is alert and oriented to person, place, and time.  Psychiatric:        Behavior: Behavior normal.      ED Treatments / Results  Labs (all labs ordered are listed, but only abnormal results are displayed) Labs Reviewed - No data to display      Radiology No results found.  Procedures Procedures (including critical care time)  Medications Ordered in ED Medications  albuterol (VENTOLIN HFA) 108 (90 Base) MCG/ACT inhaler 2 puff (2 puffs Inhalation Given 02/08/19 2005)  sodium chloride 0.9 % bolus 500 mL (0 mLs Intravenous Stopped 02/08/19 1758)  methylPREDNISolone sodium succinate  (SOLU-MEDROL) 125 mg/2 mL injection 125 mg (125 mg Intravenous Given 02/08/19 1716)  diphenhydrAMINE (BENADRYL) injection 25 mg (25 mg Intravenous Given 02/08/19 1715)  famotidine (PEPCID) IVPB 20 mg premix (0 mg Intravenous Stopped 02/08/19 1758)  predniSONE (DELTASONE) tablet 40 mg (  40 mg Oral Given 02/08/19 2033)     Initial Impression / Assessment and Plan / ED Course  I have reviewed the triage vital signs and the nursing notes.  Pertinent labs & imaging results that were available during my care of the patient were reviewed by me and considered in my medical decision making (see chart for details).        Patient presents with allergic response suspected secondary to alpha gal phenomenon.  IV steroids, IV Benadryl, IV Pepcid, albuterol MDI.  She was initially hypertensive and tachycardic.  Vital signs have improved with time.  Patient rechecked several times in her ED course.  She remained hemodynamically stable.  Blood pressure and pulse have improved dramatically.  Discharge medications EpiPen and prednisone.  Final Clinical Impressions(s) / ED Diagnoses   Final diagnoses:  Allergic reaction, initial encounter    ED Discharge Orders         Ordered    EPINEPHrine (EPIPEN 2-PAK) 0.3 mg/0.3 mL IJ SOAJ injection   Once     02/08/19 2034    predniSONE (DELTASONE) 10 MG tablet  Daily with breakfast     02/08/19 2034           Donnetta Hutchingook, Keil Pickering, MD 02/08/19 Mallie [id number]    Donnetta Hutchingook, Gordana Kewley, MD 02/08/19 2049

## 2019-02-13 LAB — CBC
Hematocrit: 39.9 % (ref 34.0–46.6)
Hemoglobin: 13.5 g/dL (ref 11.1–15.9)
MCH: 29.5 pg (ref 26.6–33.0)
MCHC: 33.8 g/dL (ref 31.5–35.7)
MCV: 87 fL (ref 79–97)
Platelets: 274 10*3/uL (ref 150–450)
RBC: 4.57 x10E6/uL (ref 3.77–5.28)
RDW: 13.5 % (ref 11.7–15.4)
WBC: 6.6 10*3/uL (ref 3.4–10.8)

## 2019-02-13 LAB — BASIC METABOLIC PANEL
BUN/Creatinine Ratio: 20 (ref 9–23)
BUN: 13 mg/dL (ref 6–24)
CO2: 21 mmol/L (ref 20–29)
Calcium: 9.5 mg/dL (ref 8.7–10.2)
Chloride: 100 mmol/L (ref 96–106)
Creatinine, Ser: 0.66 mg/dL (ref 0.57–1.00)
GFR calc Af Amer: 117 mL/min/{1.73_m2} (ref >59)
GFR calc non Af Amer: 101 mL/min/{1.73_m2} (ref >59)
Glucose: 92 mg/dL (ref 65–99)
Potassium: 3.7 mmol/L (ref 3.5–5.2)
Sodium: 139 mmol/L (ref 134–144)

## 2019-02-18 ENCOUNTER — Telehealth: Payer: Self-pay

## 2019-02-18 DIAGNOSIS — I10 Essential (primary) hypertension: Secondary | ICD-10-CM

## 2019-02-18 DIAGNOSIS — Z79899 Other long term (current) drug therapy: Secondary | ICD-10-CM

## 2019-02-18 DIAGNOSIS — R002 Palpitations: Secondary | ICD-10-CM

## 2019-02-18 MED ORDER — CHLORTHALIDONE 25 MG PO TABS: 12.5000 mg | ORAL_TABLET | Freq: Every day | ORAL | 3 refills | Status: DC

## 2019-02-18 MED FILL — CHLORTHALIDONE 25 MG TAB: 25 | 90 days supply | Qty: 45 | Fill #0

## 2019-02-18 NOTE — Telephone Encounter (Signed)
-----   Message from Jonathan F Branch, MD sent at 02/17/2019 12:03 PM EDT ----- Labs look good. We had wanted to recheck her kidney function prior to changing her diuretic. From looking at recent visits with Dr Harrison her bp's still remain too high. I would stop HCTZ, start chlorthalidone 12.5mg daily, needs repeat BMET/Mg in 2 weeks and a bp log in 2 weeks, ok to check bp just 3 times a week doesn't have to be daily. 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    J Branch MD 

## 2019-02-18 NOTE — Telephone Encounter (Signed)
-----   Message from Arnoldo Lenis, MD sent at 02/17/2019 12:03 PM EDT ----- Labs look good. We had wanted to recheck her kidney function prior to changing her diuretic. From looking at recent visits with Dr Aline Brochure her bp's still remain too high. I would stop HCTZ, start chlorthalidone 12.5mg  daily, needs repeat BMET/Mg in 2 weeks and a bp log in 2 weeks, ok to check bp just 3 times a week doesn't have to be daily. 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    Zandra Abts MD

## 2019-02-18 NOTE — Telephone Encounter (Signed)
Pt returned call. Advised pt of findings and medication changes.

## 2019-02-18 NOTE — Telephone Encounter (Signed)
Called pt. No answer, left message for pt to return call.  

## 2019-03-02 ENCOUNTER — Encounter: Payer: Self-pay | Admitting: Orthopedic Surgery

## 2019-03-02 ENCOUNTER — Other Ambulatory Visit: Payer: Self-pay

## 2019-03-02 ENCOUNTER — Ambulatory Visit: Payer: No Typology Code available for payment source | Admitting: Orthopedic Surgery

## 2019-03-02 VITALS — BP 148/107 | HR 96 | Temp 97.0°F | Ht 62.0 in | Wt 167.0 lb

## 2019-03-02 DIAGNOSIS — S86012S Strain of left Achilles tendon, sequela: Secondary | ICD-10-CM

## 2019-03-02 NOTE — Progress Notes (Signed)
Progress Note   Patient ID: Judy Harrison, female   DOB: 13-Jun-1965, 53 y.o.   MRN: [id number]   Chief Complaint  Patient presents with  . Follow-up    Recheck on left achilles.    Encounter Diagnosis  Name Primary?  . Partial tear of left Achilles tendon, sequela Yes    53 YO F WITH 1/2 INCH WEDGE IN LEFT SHOE FOR ACHILLES TENDON STRAIN USING SOME CBD OIL FEELING BETTER   SHES OK WITH 12 HR IF 2 DAYS IN A ROM BUT NOW 3       ROS    BP (!) 148/107   Pulse 96   Temp (!) 97 F (36.1 C)   Ht 5\' 2"  (1.575 m)   Wt 167 lb (75.8 kg)   BMI 30.54 kg/m   Physical Exam Vitals signs and nursing note reviewed.  Constitutional:      Appearance: Normal appearance.  Neurological:     Mental Status: She is alert and oriented to person, place, and time.  Psychiatric:        Mood and Affect: Mood normal.   Left Achilles has a nodule of tendinosis some mild tenderness and swelling after being on her feet range of motion is normal integrity of the tendon is normal plantar flexion strength is good   Medical decisions:  (Established problem worse, x-ray ,physical therapy, over-the-counter medicines, read outside film or summarize x-ray)  Data  Imaging:   *None  Encounter Diagnosis  Name Primary?  . Partial tear of left Achilles tendon, sequela Yes    PLAN:   Follow-up in 6 months continue to use the wedge in her shoe okay to use medical grade CBD oil work restrictions 12-hour shifts 2 in a row but not 3 in a row    Arther Abbott, MD 03/02/2019 9:56 AM

## 2019-03-02 NOTE — Patient Instructions (Signed)
Work: 12 hrs but only 2 in a row

## 2019-04-06 MED FILL — METOPROLOL TARTRATE 25 MG T: 25 | 90 days supply | Qty: 180 | Fill #1

## 2019-07-15 ENCOUNTER — Encounter: Payer: Self-pay | Admitting: Family Medicine

## 2019-08-31 ENCOUNTER — Ambulatory Visit: Payer: No Typology Code available for payment source | Admitting: Orthopedic Surgery

## 2019-08-31 ENCOUNTER — Encounter: Payer: Self-pay | Admitting: Orthopedic Surgery

## 2020-02-25 ENCOUNTER — Other Ambulatory Visit (HOSPITAL_COMMUNITY)
Admission: RE | Admit: 2020-02-25 | Discharge: 2020-02-25 | Disposition: A | Discharge status: Home / Self Care | Payer: No Typology Code available for payment source | Source: Ambulatory Visit | Attending: Adult Health | Admitting: Adult Health

## 2020-02-25 ENCOUNTER — Ambulatory Visit (INDEPENDENT_AMBULATORY_CARE_PROVIDER_SITE_OTHER): Payer: No Typology Code available for payment source | Admitting: Adult Health

## 2020-02-25 ENCOUNTER — Encounter: Payer: Self-pay | Admitting: Adult Health

## 2020-02-25 ENCOUNTER — Other Ambulatory Visit: Payer: Self-pay

## 2020-02-25 ENCOUNTER — Ambulatory Visit (HOSPITAL_COMMUNITY)
Admission: RE | Admit: 2020-02-25 | Discharge: 2020-02-25 | Disposition: A | Discharge status: Home / Self Care | Payer: No Typology Code available for payment source | Source: Ambulatory Visit | Attending: Adult Health | Admitting: Adult Health

## 2020-02-25 VITALS — BP 200/113 | HR 82 | Ht 62.5 in | Wt 165.0 lb

## 2020-02-25 DIAGNOSIS — N941 Unspecified dyspareunia: Secondary | ICD-10-CM | POA: Diagnosis not present

## 2020-02-25 DIAGNOSIS — Z1211 Encounter for screening for malignant neoplasm of colon: Secondary | ICD-10-CM | POA: Insufficient documentation

## 2020-02-25 DIAGNOSIS — R0989 Other specified symptoms and signs involving the circulatory and respiratory systems: Secondary | ICD-10-CM | POA: Insufficient documentation

## 2020-02-25 DIAGNOSIS — Z01419 Encounter for gynecological examination (general) (routine) without abnormal findings: Secondary | ICD-10-CM

## 2020-02-25 DIAGNOSIS — I10 Essential (primary) hypertension: Secondary | ICD-10-CM

## 2020-02-25 DIAGNOSIS — F439 Reaction to severe stress, unspecified: Secondary | ICD-10-CM | POA: Insufficient documentation

## 2020-02-25 LAB — HEMOCCULT GUIAC POC 1CARD (OFFICE): Fecal Occult Blood, POC: NEGATIVE

## 2020-02-25 MED ORDER — METOPROLOL TARTRATE 25 MG PO TABS: 25.0000 mg | ORAL_TABLET | Freq: Two times a day (BID) | ORAL | 0 refills | Status: DC

## 2020-02-25 MED ORDER — CHLORTHALIDONE 25 MG PO TABS: 12.5000 mg | ORAL_TABLET | Freq: Every day | ORAL | 3 refills | Status: DC

## 2020-02-25 NOTE — Progress Notes (Signed)
Patient ID: Judy Harrison, female   DOB: December 16, 1965, 54 y.o.   MRN: [id number] History of Present Illness:  Judy Harrison is a 54 year old white female,married, [id number], in for a well woman gyn exam and pap. She was sick last year in February after [employer] concert for like 3-4 days in the bed, no taste or smell. Has allergies and has appt 9/22 with allergists. She is working 12 hour nights at [employer] over schedule and daughter has agoraphobia and is home now, home schooling. Has aging parents in [place], has been there some, mom had surgery. She has been off BP meds for about 3 months, will make appt with cardiologist.    PCP is Dr Ladona Ridgel  Current Medications, Allergies, Past Medical History, Past Surgical History, Family History and Social History were reviewed in [employer] [facility] electronic medical record.     Review of Systems: Patient denies any headaches, hearing loss,  blurred vision, shortness of breath, chest pain, abdominal pain, problems with bowel movements, urination. No joint pain or mood swings. +fatigue +weight gain of 8-9 lbs in last 3 weeks +stressed Has pain with sex seems positional No vaginal bleeding since ablation +hot flashes at times    Physical Exam: BP (!) 200/113 (BP Location: Right Arm, Cuff Size: Normal)    Pulse 82    Ht 5' 2.5" (1.588 m)    Wt 165 lb (74.8 kg)    BMI 29.70 kg/m  BP was 184/96 General:  Well developed, well nourished, no acute distress Skin:  Warm and dry Neck:  Midline trachea, normal thyroid, good ROM, no lymphadenopathy Lungs: has rhonchi both lungs, not moving air well Breast:  No dominant palpable mass, retraction, or nipple discharge Cardiovascular: Regular rate and rhythm Abdomen:  Soft, non tender, no hepatosplenomegaly Pelvic:  External genitalia is normal in appearance, no lesions.  The vagina is normal in appearance. Urethra has no lesions or masses. The cervix is bulbous. Pap with high risk HPV genotyping performed. Uterus  is felt to be normal size, shape, and contour.  No adnexal masses or tenderness noted.Bladder is non tender, no masses felt. Rectal: Good sphincter tone, no polyps, or hemorrhoids felt.  Hemoccult negative. Extremities/musculoskeletal:  No swelling or varicosities noted, no clubbing or cyanosis Psych:  No mood changes, alert and cooperative,seems happy,but stressed AA is 3 Fall risk is low PHQ 9 score is 6, no SI, discussed meds and she will think about it and let me know   Upstream - 02/25/20 1402      Pregnancy Intention Screening   Does the patient want to become pregnant in the next year? No    Does the patient's partner want to become pregnant in the next year? No    Would the patient like to discuss contraceptive options today? No      Contraception Wrap Up   Current Method Female Sterilization    End Method Female Sterilization    Contraception Counseling Provided No         Examination chaperoned by Nance Pear LPN    Impression and Plan: 1. Encounter for gynecological examination with Papanicolaou smear of cervix Pap sent Physical in 1 year Pap in 3 if normal Mammogram now Consider labs or at get with cardiology   2. Encounter for screening fecal occult blood testing  3. Dyspareunia in female Change positions  4. Rhonchi Will get chest Xray today  5. Hypertension, unspecified type Will refill meds Meds ordered this encounter  Medications  metoprolol tartrate (LOPRESSOR) 25 MG tablet    Sig: Take 1 tablet (25 mg total) by mouth 2 (two) times daily. (BETA BLOCKER) SCHEDULE OV FOR FURTHER REFILLS.    Dispense:  180 tablet    Refill:  0    Order Specific Question:   Supervising Provider    Answer:   Duane Lope H [[id number]]   chlorthalidone (HYGROTON) 25 MG tablet    Sig: Take 0.5 tablets (12.5 mg total) by mouth daily.    Dispense:  45 tablet    Refill:  3    Order Specific Question:   Supervising Provider    Answer:   Duane Lope H [[id number]]  keep BP  log Follow up with me in 6 weeks Get cardiology appt, ASAP  6. Stress Will think about meds and let me know

## 2020-02-25 NOTE — Addendum Note (Signed)
Addended by: Cyril Mourning A on: 02/25/2020 03:18 PM   Modules accepted: Orders

## 2020-02-29 LAB — CYTOLOGY - PAP
Adequacy: ABSENT
Comment: NEGATIVE
Diagnosis: NEGATIVE
High risk HPV: NEGATIVE

## 2020-03-02 ENCOUNTER — Other Ambulatory Visit: Payer: Self-pay

## 2020-03-02 ENCOUNTER — Ambulatory Visit: Payer: No Typology Code available for payment source | Admitting: Allergy & Immunology

## 2020-03-02 ENCOUNTER — Ambulatory Visit: Payer: Self-pay

## 2020-03-02 ENCOUNTER — Encounter: Payer: Self-pay | Admitting: Allergy & Immunology

## 2020-03-02 VITALS — BP 150/100 | HR 60 | Temp 97.5°F | Resp 18 | Ht 62.5 in | Wt 162.4 lb

## 2020-03-02 DIAGNOSIS — J302 Other seasonal allergic rhinitis: Secondary | ICD-10-CM | POA: Insufficient documentation

## 2020-03-02 DIAGNOSIS — J3089 Other allergic rhinitis: Secondary | ICD-10-CM | POA: Diagnosis not present

## 2020-03-02 DIAGNOSIS — J4520 Mild intermittent asthma, uncomplicated: Secondary | ICD-10-CM | POA: Insufficient documentation

## 2020-03-02 DIAGNOSIS — L239 Allergic contact dermatitis, unspecified cause: Secondary | ICD-10-CM | POA: Diagnosis not present

## 2020-03-02 DIAGNOSIS — J310 Chronic rhinitis: Secondary | ICD-10-CM | POA: Diagnosis not present

## 2020-03-02 DIAGNOSIS — K9049 Malabsorption due to intolerance, not elsewhere classified: Secondary | ICD-10-CM | POA: Insufficient documentation

## 2020-03-02 MED ORDER — ALBUTEROL SULFATE HFA 108 (90 BASE) MCG/ACT IN AERS: 2.0000 | INHALATION_SPRAY | Freq: Four times a day (QID) | RESPIRATORY_TRACT | 2 refills | Status: DC | PRN

## 2020-03-02 MED ORDER — AZELASTINE HCL 0.1 % NA SOLN: 2.0000 | Freq: Two times a day (BID) | NASAL | 5 refills | Status: DC

## 2020-03-02 MED ORDER — EPINEPHRINE 0.3 MG/0.3ML IJ SOAJ: 0.3000 mg | Freq: Once | INTRAMUSCULAR | 2 refills | Status: AC

## 2020-03-02 NOTE — Patient Instructions (Addendum)
1. Chronic rhinitis - Testing today showed: ragweed, weeds, trees, indoor molds, outdoor molds, dust mites, cat and dog - Copy of test results provided.  - Avoidance measures provided. - Continue taking: Zyrtec (cetirizine) 10mg  daily (can increase to twice daily) - Start taking: Nasonex (momentason) one spray per nostril daily and Astelin (azelastine) 2 sprays per nostril 1-2 times daily as needed - You can use an extra dose of the antihistamine, if needed, for breakthrough symptoms.  - Consider nasal saline rinses 1-2 times daily to remove allergens from the nasal cavities as well as help with mucous clearance (this is especially helpful to do before the nasal sprays are given) - Consider allergy shots as a means of long-term control. - Allergy shots "re-train" and "reset" the immune system to ignore environmental allergens and decrease the resulting immune response to those allergens (sneezing, itchy watery eyes, runny nose, nasal congestion, etc).    - Allergy shots improve symptoms in 75-85% of patients.  - We can discuss more at the next appointment if the medications are not working for you.  2. Mild intermittent asthma, uncomplicated - Lung testing looked great today. - I think your cough is related to the postnasal drip. - Continue with albuterol as needed. - I do not think that we need to do a controller medication at this time.  3. Allergic contact dermatitis - We can consider patch testing in the future.  4. Adverse food reaction - Testing to the entire food panel was negative. - Copy of testing results provided. - We are going to get an alpha gal panel, onion IgE, and paprika IgE. - WE are also going to get a serum tryptase.  - Some of your symptoms might be related to oral allergy syndrome (information provided below). - Anaphylaxis management plan provided today. - EpiPen training provided.   5. Return in about 3 months (around 06/01/2020).    Please inform us of any  Emergency Department visits, hospitalizations, or changes in symptoms. Call us before going to the ED for breathing or allergy symptoms since we might be able to fit you in for a sick visit. Feel free to contact us anytime with any questions, problems, or concerns.  It was a pleasure to meet you again today!  Websites that have reliable patient information: 1. American Academy of Asthma, Allergy, and Immunology: www.aaaai.org 2. Food Allergy Research and Education (FARE): foodallergy.org 3. Mothers of Asthmatics: http://www.asthmacommunitynetwork.org 4. American College of Allergy, Asthma, and Immunology: www.acaai.org   COVID-19 Vaccine Information can be found at: [email or web address] For questions related to vaccine distribution or appointments, please email [email or web address] or call [phone].     "Like" us on Facebook and Instagram for our latest updates!        Make sure you are registered to vote! If you have moved or changed any of your contact information, you will need to get this updated before voting!  In some cases, you MAY be able to register to vote online: [email or web address]    The oral allergy syndrome (OAS) or pollen-food allergy syndrome (PFAS) is a relatively common form of food allergy, particularly in adults. It typically occurs in people who have pollen allergies when the immune system "sees" proteins on the food that look like proteins on the pollen. This results in the allergy antibody (IgE) binding to the food instead of the pollen. Patients typically report itching and/or mild swelling of the mouth and throat immediately following ingestion of certain uncooked fruits (including nuts) or raw  vegetables. Only a very small number of affected individuals experience systemic allergic reactions, such as anaphylaxis which occurs with true food allergies.       Reducing  Pollen Exposure  The American Academy of Allergy, Asthma and Immunology suggests the following steps to reduce your exposure to pollen during allergy seasons.    1. Do not hang sheets or clothing out to dry; pollen may collect on these items. 2. Do not mow lawns or spend time around freshly cut grass; mowing stirs up pollen. 3. Keep windows closed at night.  Keep car windows closed while driving. 4. Minimize morning activities outdoors, a time when pollen counts are usually at their highest. 5. Stay indoors as much as possible when pollen counts or humidity is high and on windy days when pollen tends to remain in the air longer. 6. Use air conditioning when possible.  Many air conditioners have filters that trap the pollen spores. 7. Use a HEPA room air filter to remove pollen form the indoor air you breathe.  Control of Mold Allergen   Mold and fungi can grow on a variety of surfaces provided certain temperature and moisture conditions exist.  Outdoor molds grow on plants, decaying vegetation and soil.  The major outdoor mold, Alternaria and Cladosporium, are found in very high numbers during hot and dry conditions.  Generally, a late Summer - Fall peak is seen for common outdoor fungal spores.  Rain will temporarily lower outdoor mold spore count, but counts rise rapidly when the rainy period ends.  The most important indoor molds are Aspergillus and Penicillium.  Dark, humid and poorly ventilated basements are ideal sites for mold growth.  The next most common sites of mold growth are the bathroom and the kitchen.  Outdoor (Seasonal) Mold Control  Positive outdoor molds via skin testing: Alternaria, Cladosporium, Bipolaris (Helminthsporium), Drechslera (Curvalaria) and Mucor  1. Use air conditioning and keep windows closed 2. Avoid exposure to decaying vegetation. 3. Avoid leaf raking. 4. Avoid grain handling. 5. Consider wearing a face mask if working in moldy areas.  6.   Indoor  (Perennial) Mold Control   Positive indoor molds via skin testing: Aspergillus, Penicillium, Fusarium, Aureobasidium (Pullulara) and Rhizopus  1. Maintain humidity below 50%. 2. Clean washable surfaces with 5% bleach solution. 3. Remove sources e.g. contaminated carpets.     Control of Dust Mite Allergen    Dust mites play a major role in allergic asthma and rhinitis.  They occur in environments with high humidity wherever human skin is found.  Dust mites absorb humidity from the atmosphere (ie, they do not drink) and feed on organic matter (including shed human and animal skin).  Dust mites are a microscopic type of insect that you cannot see with the naked eye.  High levels of dust mites have been detected from mattresses, pillows, carpets, upholstered furniture, bed covers, clothes, soft toys and any woven material.  The principal allergen of the dust mite is found in its feces.  A gram of dust may contain 1,000 mites and 250,000 fecal particles.  Mite antigen is easily measured in the air during house cleaning activities.  Dust mites do not bite and do not cause harm to humans, other than by triggering allergies/asthma.    Ways to decrease your exposure to dust mites in your home:  1. Encase mattresses, box springs and pillows with a mite-impermeable barrier or cover   2. Wash sheets, blankets and drapes weekly in hot water (130 F) with  detergent and dry them in a dryer on the hot setting.  3. Have the room cleaned frequently with a vacuum cleaner and a damp dust-mop.  For carpeting or rugs, vacuuming with a vacuum cleaner equipped with a high-efficiency particulate air (HEPA) filter.  The dust mite allergic individual should not be in a room which is being cleaned and should wait 1 hour after cleaning before going into the room. 4. Do not sleep on upholstered furniture (eg, couches).   5. If possible removing carpeting, upholstered furniture and drapery from the home is ideal.  Horizontal  blinds should be eliminated in the rooms where the person spends the most time (bedroom, study, television room).  Washable vinyl, roller-type shades are optimal. 6. Remove all non-washable stuffed toys from the bedroom.  Wash stuffed toys weekly like sheets and blankets above.   7. Reduce indoor humidity to less than 50%.  Inexpensive humidity monitors can be purchased at most hardware stores.  Do not use a humidifier as can make the problem worse and are not recommended.  Control of Dog or Cat Allergen  Avoidance is the best way to manage a dog or cat allergy. If you have a dog or cat and are allergic to dog or cats, consider removing the dog or cat from the home. If you have a dog or cat but don't want to find it a new home, or if your family wants a pet even though someone in the household is allergic, here are some strategies that may help keep symptoms at bay:  1. Keep the pet out of your bedroom and restrict it to only a few rooms. Be advised that keeping the dog or cat in only one room will not limit the allergens to that room. 2. Don't pet, hug or kiss the dog or cat; if you do, wash your hands with soap and water. 3. High-efficiency particulate air (HEPA) cleaners run continuously in a bedroom or living room can reduce allergen levels over time. 4. Regular use of a high-efficiency vacuum cleaner or a central vacuum can reduce allergen levels. 5. Giving your dog or cat a bath at least once a week can reduce airborne allergen.  Allergy Shots   Allergies are the result of a chain reaction that starts in the immune system. Your immune system controls how your body defends itself. For instance, if you have an allergy to pollen, your immune system identifies pollen as an invader or allergen. Your immune system overreacts by producing antibodies called Immunoglobulin E (IgE). These antibodies travel to cells that release chemicals, causing an allergic reaction.  The concept behind allergy  immunotherapy, whether it is received in the form of shots or tablets, is that the immune system can be desensitized to specific allergens that trigger allergy symptoms. Although it requires time and patience, the payback can be long-term relief.  How Do Allergy Shots Work?  Allergy shots work much like a vaccine. Your body responds to injected amounts of a particular allergen given in increasing doses, eventually developing a resistance and tolerance to it. Allergy shots can lead to decreased, minimal or no allergy symptoms.  There generally are two phases: build-up and maintenance. Build-up often ranges from three to six months and involves receiving injections with increasing amounts of the allergens. The shots are typically given once or twice a week, though more rapid build-up schedules are sometimes used.  The maintenance phase begins when the most effective dose is reached. This dose is different for  each person, depending on how allergic you are and your response to the build-up injections. Once the maintenance dose is reached, there are longer periods between injections, typically two to four weeks.  Occasionally doctors give cortisone-type shots that can temporarily reduce allergy symptoms. These types of shots are different and should not be confused with allergy immunotherapy shots.  Who Can Be Treated with Allergy Shots?  Allergy shots may be a good treatment approach for people with allergic rhinitis (hay fever), allergic asthma, conjunctivitis (eye allergy) or stinging insect allergy.   Before deciding to begin allergy shots, you should consider:  . The length of allergy season and the severity of your symptoms . Whether medications and/or changes to your environment can control your symptoms . Your desire to avoid long-term medication use . Time: allergy immunotherapy requires a major time commitment . Cost: may vary depending on your insurance coverage  Allergy shots for  children age 61 and older are effective and often well tolerated. They might prevent the onset of new allergen sensitivities or the progression to asthma.  Allergy shots are not started on patients who are pregnant but can be continued on patients who become pregnant while receiving them. In some patients with other medical conditions or who take certain common medications, allergy shots may be of risk. It is important to mention other medications you talk to your allergist.   When Will I Feel Better?  Some may experience decreased allergy symptoms during the build-up phase. For others, it may take as long as 12 months on the maintenance dose. If there is no improvement after a year of maintenance, your allergist will discuss other treatment options with you.  If you aren't responding to allergy shots, it may be because there is not enough dose of the allergen in your vaccine or there are missing allergens that were not identified during your allergy testing. Other reasons could be that there are high levels of the allergen in your environment or major exposure to non-allergic triggers like tobacco smoke.  What Is the Length of Treatment?  Once the maintenance dose is reached, allergy shots are generally continued for three to five years. The decision to stop should be discussed with your allergist at that time. Some people may experience a permanent reduction of allergy symptoms. Others may relapse and a longer course of allergy shots can be considered.  What Are the Possible Reactions?  The two types of adverse reactions that can occur with allergy shots are local and systemic. Common local reactions include very mild redness and swelling at the injection site, which can happen immediately or several hours after. A systemic reaction, which is less common, affects the entire body or a particular body system. They are usually mild and typically respond quickly to medications. Signs include  increased allergy symptoms such as sneezing, a stuffy nose or hives.  Rarely, a serious systemic reaction called anaphylaxis can develop. Symptoms include swelling in the throat, wheezing, a feeling of tightness in the chest, nausea or dizziness. Most serious systemic reactions develop within 30 minutes of allergy shots. This is why it is strongly recommended you wait in your doctor's office for 30 minutes after your injections. Your allergist is trained to watch for reactions, and his or her staff is trained and equipped with the proper medications to identify and treat them.  Who Should Administer Allergy Shots?  The preferred location for receiving shots is your prescribing allergist's office. Injections can sometimes be given at  another facility where the physician and staff are trained to recognize and treat reactions, and have received instructions by your prescribing allergist.

## 2020-03-02 NOTE — Progress Notes (Signed)
NEW PATIENT  Date of Service/Encounter:  03/02/20  Referring provider: Erven Colla, DO   Assessment:   Perennial and seasonal allergic rhinitis (ragweed, weeds, trees, indoor molds, outdoor molds, dust mites, cat and dog)  Mild intermittent asthma, uncomplicated  Allergic contact dermatitis - could benefit from patch testing  Food intolerance - possible oral allergy syndrome  Increased blood pressure reading - with systolic in the 016P   Plan/Recommendations:   1. Chronic rhinitis - Testing today showed: ragweed, weeds, trees, indoor molds, outdoor molds, dust mites, cat and dog - Copy of test results provided.  - Avoidance measures provided. - Continue taking: Zyrtec (cetirizine) 53m daily (can increase to twice daily) - Start taking: Nasonex (momentason) one spray per nostril daily and Astelin (azelastine) 2 sprays per nostril 1-2 times daily as needed - You can use an extra dose of the antihistamine, if needed, for breakthrough symptoms.  - Consider nasal saline rinses 1-2 times daily to remove allergens from the nasal cavities as well as help with mucous clearance (this is especially helpful to do before the nasal sprays are given) - Consider allergy shots as a means of long-term control. - Allergy shots "re-train" and "reset" the immune system to ignore environmental allergens and decrease the resulting immune response to those allergens (sneezing, itchy watery eyes, runny nose, nasal congestion, etc).    - Allergy shots improve symptoms in 75-85% of patients.  - We can discuss more at the next appointment if the medications are not working for you.  2. Mild intermittent asthma, uncomplicated - Lung testing looked great today. - I think your cough is related to the postnasal drip. - Continue with albuterol as needed. - I do not think that we need to do a controller medication at this time.  3. Allergic contact dermatitis - We can consider patch testing in the  future.  4. Adverse food reaction - Testing to the entire food panel was negative. - Copy of testing results provided. - We are going to get an alpha gal panel, onion IgE, and paprika IgE. - WE are also going to get a serum tryptase.  - Some of your symptoms might be related to oral allergy syndrome (information provided below). - Anaphylaxis management plan provided today. - EpiPen training provided.   5. Return in about 3 months (around 06/01/2020).   Subjective:   Judy ARMSTEADis a 54y.o. female presenting today for evaluation of  Chief Complaint  Patient presents with  . Allergies  . Allergic Reaction    Judy KLEMANhas a history of the following: Patient Active Problem List   Diagnosis Date Noted  . Seasonal and perennial allergic rhinitis 03/02/2020  . Mild intermittent asthma, uncomplicated 053/74/8270 . Allergic contact dermatitis 03/02/2020  . Food intolerance 03/02/2020  . Hypertension 02/25/2020  . Rhonchi 02/25/2020  . Dyspareunia in female 02/25/2020  . Encounter for screening fecal occult blood testing 02/25/2020  . Stress 02/25/2020  . Rupture of left plantaris tendon 09/15/2018  . Partial tear of left Achilles tendon 09/15/2018  . Screening for colorectal cancer 11/22/2017  . Encounter for gynecological examination with Papanicolaou smear of cervix 11/22/2017  . Elevated BP without diagnosis of hypertension 11/22/2017  . UTI (lower urinary tract infection) 02/08/2014  . Epidermal cyst 02/08/2014  . Peri-menopausal 02/08/2014  . PALPITATIONS 07/03/2010  . 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 THRUSH 04/26/2008  . PNEUMONIA 04/26/2008  . PYELONEPHRITIS 04/26/2008  . NEPHROLITHIASIS 04/26/2008  . BACK  PAIN, CHRONIC 04/26/2008  . RESPIRATORY DISTRESS 04/26/2008  . SEPSIS 04/26/2008    History obtained from: chart review and patient.  Judy Harrison was referred by Erven Colla, DO.     Judy Harrison is a 54 y.o. female presenting for an evaluation of  multiple atopic complaints.   Asthma/Respiratory Symptom History: She did have an albuterol inhaler in the hospital that seemed to help. She does not have one on hand. She has never been on a controller medication.   Allergic Rhinitis Symptom History: She does have reactions around cats and dogs. She has a cat in the home but she does not do anything with it since her kids takes care of it. She stays away from it. She reports a lot of postnasal drip with a persistent cough. She has been on fluticasone without much improvement. She is currently on an antihistamine daily, typically cetirizine.   Food Allergy Symptom History: She reports that she has the same symptoms as her daughter, who has alpha gal. She reports that for the last 6 years or so, she has had some problems with red meats. Within 6 hours of eating beef, she developed problems with diarrhea and vomiting and cramping. She was also breaking out in hives on the back of her neck and down her arms. She went to the ED and was treated there. Her most recent reaction was in August 2020. She went to the ED that time and she thinks that the chicken was cooked in the grease with sausage and steak as well. She loves the biscuits, but she has started avoiding this place because of the recent reaction. These reactions take her out "for a day" after everything is treated. She has started not eating anything fried when she is eating out because she does not want to take a chace.   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 She was intubated at [facility] in [place]. This was years ago. She would have problems with allergic reactions when she was at various events as a child. She has been allergy tested in the past with Dr. Ishmael Holter.   She thinks that she might be reacting to paprika. She had a reaction to something with paprika and this was the only new item that she had. Recently in August 2021, she had a reaction to some Dortios Nacho chips. She is unsure of what  happened but she was developing itching shortly after starting to eat these. She saw "seasoning" listed on the ingredients package. She has eaten these on a number of occasions without a problem. In any case, she took two Benadryl, chugged some tea and kept driving home (over two hours away when she ended up taking the Benadryl).   She has also developed some lip swelling when she used a particular lip balm. She has a picture of the ingredients in the balm. She now no longer uses it and has no problems with other cosmetics.    Otherwise, there is no history of other atopic diseases, including drug allergies, stinging insect allergies, eczema or urticaria. There is no significant infectious history. Vaccinations are up to date.    Past Medical History: Patient Active Problem List   Diagnosis Date Noted  . Seasonal and perennial allergic rhinitis 03/02/2020  . Mild intermittent asthma, uncomplicated 79/07/4095  . Allergic contact dermatitis 03/02/2020  . Food intolerance 03/02/2020  . Hypertension 02/25/2020  . Rhonchi 02/25/2020  . Dyspareunia in female 02/25/2020  . Encounter for screening fecal  occult blood testing 02/25/2020  . Stress 02/25/2020  . Rupture of left plantaris tendon 09/15/2018  . Partial tear of left Achilles tendon 09/15/2018  . Screening for colorectal cancer 11/22/2017  . Encounter for gynecological examination with Papanicolaou smear of cervix 11/22/2017  . Elevated BP without diagnosis of hypertension 11/22/2017  . UTI (lower urinary tract infection) 02/08/2014  . Epidermal cyst 02/08/2014  . Peri-menopausal 02/08/2014  . PALPITATIONS 07/03/2010  . 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 THRUSH 04/26/2008  . PNEUMONIA 04/26/2008  . PYELONEPHRITIS 04/26/2008  . NEPHROLITHIASIS 04/26/2008  . BACK PAIN, CHRONIC 04/26/2008  . RESPIRATORY DISTRESS 04/26/2008  . SEPSIS 04/26/2008    Medication List:  Allergies as of 03/02/2020      Reactions   Onion  Anaphylaxis   Pork-derived Products Anaphylaxis   Beef-derived Products Hives   Cedar    Sulfonamide Derivatives Hives, Swelling, Rash      Medication List       Accurate as of March 02, 2020  1:58 PM. If you have any questions, ask your nurse or doctor.        albuterol 108 (90 Base) MCG/ACT inhaler Commonly known as: VENTOLIN HFA Inhale 2 puffs into the lungs every 6 (six) hours as needed for wheezing or shortness of breath. Started by: Valentina Shaggy, MD   aspirin 81 MG tablet Take 81 mg by mouth daily.   azelastine 0.1 % nasal spray Commonly known as: ASTELIN Place 2 sprays into both nostrils 2 (two) times daily. Started by: Valentina Shaggy, MD   cetirizine 10 MG tablet Commonly known as: ZYRTEC Take 10 mg by mouth daily as needed for allergies.   chlorthalidone 25 MG tablet Commonly known as: HYGROTON Take 0.5 tablets (12.5 mg total) by mouth daily.   diphenhydrAMINE 25 MG tablet Commonly known as: BENADRYL Take 1 tablet (25 mg total) by mouth every 6 (six) hours. What changed:   how much to take  when to take this   EPINEPHrine 0.3 mg/0.3 mL Soaj injection Commonly known as: EPI-PEN Inject 0.3 mg into the muscle once for 1 dose.   ibuprofen 800 MG tablet Commonly known as: ADVIL Take 800 mg by mouth daily as needed for mild pain or moderate pain.   metoprolol tartrate 25 MG tablet Commonly known as: LOPRESSOR Take 1 tablet (25 mg total) by mouth 2 (two) times daily. (BETA BLOCKER) SCHEDULE OV FOR FURTHER REFILLS.       Birth History: non-contributory  Developmental History: Quynh has met all milestones on time. She has required no speech therapy, occupational therapy and physical therapy.   Past Surgical History: Past Surgical History:  Procedure Laterality Date  . CESAREAN SECTION    . ENDOMETRIAL ABLATION    . stent in kidney    . TUBAL LIGATION    . uterine abla       Family History: Family History  Problem Relation  Age of Onset  . Cancer Mother   . Hypertension Mother   . Hypertension Father   . Hyperlipidemia Father   . Hyperlipidemia Brother   . Anxiety disorder Daughter   . Other Son        lymphedema  . Hypertension Maternal Grandmother   . Other Maternal Grandmother        CVA  . Alzheimer's disease Maternal Grandmother   . Cancer Maternal Grandfather        liver, lung  . Other Paternal Grandmother        CVA  .  Alzheimer's disease Paternal Grandmother   . Heart disease Paternal Grandfather   . Heart attack Paternal Grandfather   . Other Son        lung contusion; liver lac     Social History: [place]alia lives at home with her husband.  She lives in a house that is 19 years old.  There is wood throughout the home.  They have gas and electric heating.  They have a heat pump for cooling.  There are 2 dogs and 1 cat inside of the home.  There are no animals outside of the home.  There are dust mite covers on the bed and the pillows.  There is no tobacco exposure.  She currently works as a Therapist, art at [employer].  She is exposed to fumes, chemicals, and dust in her work environment.  They do not use a HEPA filter.  They do not live near an interstate or industrial area.  There is no tobacco exposure.   Review of Systems  Constitutional: Negative.  Negative for chills, fever, malaise/fatigue and weight loss.  HENT: Positive for congestion and sinus pain. Negative for ear discharge and ear pain.   Eyes: Negative for pain, discharge and redness.  Respiratory: Negative for cough, sputum production, shortness of breath and wheezing.   Cardiovascular: Negative.  Negative for chest pain and palpitations.  Gastrointestinal: Negative for abdominal pain, constipation, diarrhea, heartburn, nausea and vomiting.  Skin: Negative.  Negative for itching and rash.  Neurological: Negative for dizziness and headaches.  Endo/Heme/Allergies: Positive for environmental allergies. Does not bruise/bleed  easily.       Objective:   Blood pressure (!) 150/100, pulse 60, temperature (!) 97.5 F (36.4 C), temperature source Temporal, resp. rate 18, height 5' 2.5" (1.588 m), weight 162 lb 6.4 oz (73.7 kg), SpO2 97 %. Body mass index is 29.23 kg/m.   Physical Exam:   Physical Exam Constitutional:      Appearance: She is well-developed.     Comments: Very talkative.  HENT:     Head: Normocephalic and atraumatic.     Right Ear: Tympanic membrane, ear canal and external ear normal. No drainage, swelling or tenderness. Tympanic membrane is not injected, scarred, erythematous, retracted or bulging.     Left Ear: Tympanic membrane, ear canal and external ear normal. No drainage, swelling or tenderness. Tympanic membrane is not injected, scarred, erythematous, retracted or bulging.     Nose: No nasal deformity, septal deviation, mucosal edema or rhinorrhea.     Right Turbinates: Enlarged, swollen and pale.     Left Turbinates: Enlarged, swollen and pale.     Right Sinus: No maxillary sinus tenderness or frontal sinus tenderness.     Left Sinus: No maxillary sinus tenderness or frontal sinus tenderness.     Mouth/Throat:     Mouth: Mucous membranes are not pale and not dry.     Pharynx: Uvula midline.  Eyes:     General:        Right eye: No discharge.        Left eye: No discharge.     Conjunctiva/sclera: Conjunctivae normal.     Right eye: Right conjunctiva is not injected. No chemosis.    Left eye: Left conjunctiva is not injected. No chemosis.    Pupils: Pupils are equal, round, and reactive to light.  Cardiovascular:     Rate and Rhythm: Normal rate and regular rhythm.     Heart sounds: Normal heart sounds.  Pulmonary:  Effort: Pulmonary effort is normal. No tachypnea, accessory muscle usage or respiratory distress.     Breath sounds: Normal breath sounds. No wheezing, rhonchi or rales.     Comments: Moving air well in all lung fields. No increased work of breathing noted.    Chest:     Chest wall: No tenderness.  Abdominal:     Tenderness: There is no abdominal tenderness. There is no guarding or rebound.  Lymphadenopathy:     Head:     Right side of head: No submandibular, tonsillar or occipital adenopathy.     Left side of head: No submandibular, tonsillar or occipital adenopathy.     Cervical: No cervical adenopathy.  Skin:    Coloration: Skin is not pale.     Findings: No abrasion, erythema, petechiae or rash. Rash is not papular, urticarial or vesicular.     Comments: No eczematous or urticarial lesions noted.   Neurological:     Mental Status: She is alert.  Psychiatric:        Behavior: Behavior is cooperative.      Diagnostic studies:   Spirometry: results normal (FEV1: 2.30/92%, FVC: 2.92/92%, FEV1/FVC: 79%).    Spirometry consistent with normal pattern.   Allergy Studies:     Airborne Adult Perc - 03/02/20 1133    Time Antigen Placed 0945    Allergen Manufacturer Lavella Hammock    Location Back    Number of Test 59    Panel 1 Select    1. Control-Buffer 50% Glycerol Negative    2. Control-Histamine 1 mg/ml 2+    3. Albumin saline Negative    4. [place] Negative    5. [place] Negative    6. Johnson Negative    7. [place] Blue Negative    8. Meadow Fescue Negative    9. Perennial Rye Negative    10. Sweet Vernal Negative    11. Timothy Negative    12. Cocklebur Negative    13. Burweed Marshelder Negative    14. Ragweed, short 4+    15. Ragweed, Giant Negative    16. Plantain,  English Negative    17. Lamb's Quarters Negative    18. Sheep Sorrell Negative    19. Rough Pigweed Negative    20. Marsh Elder, Rough Negative    21. Mugwort, Common Negative    22. Ash mix Negative    23. Birch mix Negative    24. Beech American Negative    25. Box, Elder Negative    26. Cedar, red 4+    27. Cottonwood, [place] Negative    28. Elm mix Negative    29. Hickory Negative    30. Maple mix Negative    31. Oak, [place] mix Negative    32.  Pecan Pollen Negative    33. Pine mix Negative    34. Sycamore Eastern Negative    35. Convent, Black Pollen Negative    36. Alternaria alternata Negative    37. Cladosporium Herbarum Negative    38. Aspergillus mix Negative    39. Penicillium mix Negative    40. Bipolaris sorokiniana (Helminthosporium) Negative    41. Drechslera spicifera (Curvularia) Negative    42. Mucor plumbeus Negative    43. Fusarium moniliforme Negative    44. Aureobasidium pullulans (pullulara) Negative    45. Rhizopus oryzae Negative    46. Botrytis cinera Negative    47. Epicoccum nigrum Negative    48. Phoma betae Negative    49. Candida Albicans Negative  50. Trichophyton mentagrophytes Negative    51. Mite, D Farinae  5,000 AU/ml Negative    52. Mite, D Pteronyssinus  5,000 AU/ml Negative    53. Cat Hair 10,000 BAU/ml Negative    54.  Dog Epithelia Negative    55. Mixed Feathers Negative    56. Horse Epithelia Negative    57. Cockroach, German Negative    58. Mouse Negative    59. Tobacco Leaf Negative          Intradermal - 03/02/20 1133    Time Antigen Placed 1100    Allergen Manufacturer Greer    Location Arm    Number of Test 14    Intradermal Select    Control Negative    [place] Negative    Johnson Negative    7 Grass Negative    Weed mix 1+    Tree mix Negative    Mold 1 2+    Mold 2 3+    Mold 3 1+    Mold 4 3+    Cat 3+    Dog 3+    Cockroach Negative    Mite mix 2+          Food Adult Perc - 03/02/20 1100    Time Antigen Placed 1000    1. Peanut Negative    2. Soybean Negative    3. Wheat Negative    4. Sesame Negative    5. Milk, cow Negative    6. Egg White, Chicken Negative    7. Casein Negative    8. Shellfish Mix Negative    9. Fish Mix Negative    10. Cashew Negative    11. Pecan Food Negative    12. [place] Negative    13. Almond Negative    14. Hazelnut Negative    15. [place] nut Negative    16. Coconut Negative    17. Pistachio Negative     18. Catfish Negative    19. Bass Negative    20. Trout Negative    21. Tuna Negative    22. Salmon Negative    23. Flounder Negative    24. Codfish Negative    25. Shrimp Negative    26. Crab Negative    27. Lobster Negative    28. Oyster Negative    29. Scallops Negative    30. Barley Negative    31. Oat  Negative    32. Rye  Negative    33. Hops Negative    34. Rice Negative    35. Cottonseed Negative    36. Saccharomyces Cerevisiae  Negative    37. Pork Negative    38. [place] Meat Negative    39. Chicken Meat Negative    40. Beef Negative    41. Lamb Negative    42. Tomato Negative    43. White Potato Negative    44. Sweet Potato Negative    45. Pea, Green/English Negative    46. Navy Bean Negative    47. Mushrooms Negative    48. Avocado Negative    49. Onion Negative    50. Cabbage Negative    51. Carrots Negative    52. Celery Negative    53. Corn Negative    54. Cucumber Negative    55. Grape (White seedless) Negative    56. Orange  Negative    57. Banana Negative    58. Apple Negative    59. Peach Negative    60.  Strawberry Negative    61. Cantaloupe Negative    62. Watermelon Negative    63. Pineapple Negative    64. Chocolate/Cacao bean Negative    65. Karaya Gum Negative    66. Acacia (Arabic Gum) Negative    67. Cinnamon Negative    68. Nutmeg Negative    69. Ginger Negative    70. Garlic Negative    71. Pepper, black Negative    72. Mustard Negative           Allergy testing results were read and interpreted by myself, documented by clinical staff.         Salvatore Marvel, MD [facility]

## 2020-03-03 NOTE — Addendum Note (Signed)
Addended by: Mliss Fritz I on: 03/03/2020 09:41 AM   Modules accepted: Orders

## 2020-03-03 NOTE — Addendum Note (Signed)
Addended by: Mliss Fritz I on: 03/03/2020 09:46 AM   Modules accepted: Orders

## 2020-03-21 ENCOUNTER — Encounter: Payer: Self-pay | Admitting: Family

## 2020-03-21 ENCOUNTER — Other Ambulatory Visit: Payer: Self-pay

## 2020-03-21 ENCOUNTER — Ambulatory Visit: Payer: No Typology Code available for payment source | Admitting: Family

## 2020-03-21 VITALS — BP 162/98 | HR 58 | Temp 98.0°F | Resp 16 | Ht 62.5 in | Wt 107.4 lb

## 2020-03-21 DIAGNOSIS — L239 Allergic contact dermatitis, unspecified cause: Secondary | ICD-10-CM

## 2020-03-21 NOTE — Progress Notes (Signed)
Follow-up Note  RE: JAILEIGH WEIMER MRN: [id number] DOB: [birth year] Date of Office Visit: 03/21/2020  Primary care provider: Annalee Genta, DO Referring provider: Annalee Genta, DO   Ivyanna returns to the office today for the patch test placement, given suspected history of contact dermatitis. Her blood pressure is elevated today. She reports that she forgot to take her blood pressure medication this morning. She and her primary care physician are working on what medications she best needs to be on. Instructed the importance of compliance with her blood pressure medications.   Diagnostics: True Test patches placed.    Plan:   Allergic contact dermatitis - Instructions provided on care of the patches for the next 48 hours. Almira Coaster was instructed to avoid showering for the next 48 hours. Zakiya Sporrer will follow up in 48 hours and 96 hours for patch readings.  Your blood pressure is up today. Please schedule an appointment with your primary physician to discuss.  Thank you for the opportunity to care for [facility]. Please do not hesitate to contact us with any questions.  Nehemiah Settle, FNP [facility]

## 2020-03-23 ENCOUNTER — Ambulatory Visit: Payer: No Typology Code available for payment source | Admitting: Family

## 2020-03-23 ENCOUNTER — Other Ambulatory Visit: Payer: Self-pay

## 2020-03-23 DIAGNOSIS — L239 Allergic contact dermatitis, unspecified cause: Secondary | ICD-10-CM

## 2020-03-23 NOTE — Progress Notes (Signed)
Judy Harrison returns to the office today for the final patch test interpretation, given suspected history of contact dermatitis.    Diagnostics:   TRUE TEST 48-hour hour reading: negative reaction to #1 (Nickel Sulfate), negative reaction to #2 (Wool Alcohols), negative reaction to #3 (Neomycin sulfate), negative reaction to #4 (Potassium dichromate), negative reaction to #5 [employer] mix), negative reaction to  #6 (Fragrance mix), negative reaction to #7 (Colophony), negative reaction to #9 (Paraben mix), negative reaction to #10 (Balsam of [place]), negative reaction to #11 (Ethylenediamine dihydrochloride), negative reaction to #12 (Cobalt chloride), negative reaction to #13 (p-tert Butylphenol formaldehyde resin), negative reaction to #14 (Epoxy resin), negative reaction to #15 (Carba mix), negative reaction to #16 (Black rubber mix), negative reaction to #17 (Cl+Me-Isothiazolinone), negative reaction to #18 (Quaternium-15), negative reaction to #19 (Methyldibromo Glutaronitrile), negative reaction to #20 (p-Phenylene-diamine), negative reaction to #21 (Formaldehyde), negative reaction to #22 (Mercapto mix), negative reaction to #23 (Thiomersal), negative reaction to #24 (Thiuram mix), negative reaction to #25 (Diazolidinyl urea), negative reaction to #26 (Quinoline mix), negative reaction to #27 (Tixocortol-21-pivalate), negative reaction to #28 (Gold sodium thiosulfate), negative reaction to #29 (Imidazolidinyl urea), negative reaction to #30 (Budesonide), negative reaction to #31 (Hydrocortisone-17-butyrate), negative reaction to #32 (Mercapto-benzothiazole), negative reaction to #33 (Bacitracin), negative reaction to #34 (Parthenolide), negative reaction to #35 (Disperse blue) and negative reaction to #36 (Bronopol)  Plan:   Allergic contact dermatitis Keep your 96 hour follow up appointment   Thank you for the opportunity to care for this patient. Please do not hesitate to contact us with any  questions.  Nehemiah Settle, FNP [facility]

## 2020-03-25 ENCOUNTER — Ambulatory Visit (INDEPENDENT_AMBULATORY_CARE_PROVIDER_SITE_OTHER): Payer: No Typology Code available for payment source | Admitting: Allergy & Immunology

## 2020-03-25 ENCOUNTER — Encounter: Payer: Self-pay | Admitting: Allergy & Immunology

## 2020-03-25 ENCOUNTER — Other Ambulatory Visit: Payer: Self-pay

## 2020-03-25 DIAGNOSIS — L234 Allergic contact dermatitis due to dyes: Secondary | ICD-10-CM

## 2020-03-25 NOTE — Patient Instructions (Signed)
1. Allergic contact dermatitis - Testing was slightly reactive to Disperse Blue. - Avoidance measures provided. - You seem to have a good handle on your symptoms. - Avoid the lip balm that triggers your symptoms. - Restart all of your allergy medications.   2. Follow up as scheduled.

## 2020-03-25 NOTE — Progress Notes (Signed)
    Follow-up Note  RE: Judy Harrison MRN: [id number] DOB: 1965/11/05 Date of Office Visit: 03/25/2020  Primary care provider: Annalee Genta, DO Referring provider: Annalee Genta, DO   Judy Harrison returns to the office today for the final patch test interpretation, given suspected history of contact dermatitis. She does report some itching on the right side of her back. She reports feeling fairly miserable. She stopped her Astelin and other allergy medications because of the testing.    Diagnostics:   TRUE TEST 96-hour reading: +/- reaction to #35 (Disperse blue)  Plan:   Allergic contact dermatitis - The patient has been provided detailed information regarding the substances she is sensitive to, as well as products containing the substances.   - Meticulous avoidance of these substances is recommended.  - If avoidance is not possible, the use of barrier creams or lotions is recommended. - If symptoms persist or progress despite meticulous avoidance of disperse blue, Dermatology Referral may be warranted. - I recommended restarting all of her allergy medications. - She already knows which lip balm's to avoid. - She does have cosmetics that she can tolerate without problem. - We will see her again as scheduled.  Total of 15 minutes, greater than 50% of which was spent in discussion of treatment and management options.      Malachi Bonds, MD  [facility]

## 2020-03-29 LAB — ALLERGEN, ONION, F48: Allergen Onion IgE: <0.1 kU/L

## 2020-03-29 LAB — ALPHA-GAL PANEL
Alpha Gal IgE*: 5.89 kU/L — ABNORMAL HIGH (ref <0.10)
Beef (Bos spp) IgE: 2.22 kU/L — ABNORMAL HIGH (ref <0.35)
Class Interpretation: 1
Class Interpretation: 2
Class Interpretation: 2
Lamb/Mutton (Ovis spp) IgE: 0.67 kU/L — ABNORMAL HIGH (ref <0.35)
Pork (Sus spp) IgE: 0.98 kU/L — ABNORMAL HIGH (ref <0.35)

## 2020-03-29 LAB — ALLERGEN,GRN PEPPER,PAPRIKA,F218: Paprika IgE: <0.1 kU/L

## 2020-03-29 LAB — TRYPTASE: Tryptase: 4.2 ug/L (ref 2.2–13.2)

## 2020-03-30 NOTE — Progress Notes (Deleted)
Cardiology Office Note    Date:  03/30/2020   ID:  Mavi, Un 1966-05-31, MRN [id number]  PCP:  Annalee Genta, DO  Cardiologist: Dina Rich, MD EPS: None  No chief complaint on file.   History of Present Illness:  Judy Harrison is a 54 y.o. female with history of palpitations, hypertension, normal echo in 2017.  Patient last had a telemedicine visit with Dr. Wyline Mood 11/11/2018 at which time blood pressure was running too high and he recommended changing her HCTZ to chlorthalidone.  Past Medical History:  Diagnosis Date  . Allergy to alpha-gal   . Anaphylaxis    Onions  . Epidermal cyst 02/08/2014  . Hypertension   . Kidney stone   . Peri-menopausal 02/08/2014  . Supraventricular tachycardia (HCC)   . UTI (lower urinary tract infection) 02/08/2014    Past Surgical History:  Procedure Laterality Date  . CESAREAN SECTION    . ENDOMETRIAL ABLATION    . stent in kidney    . TUBAL LIGATION    . uterine abla      Current Medications: No outpatient medications have been marked as taking for the 04/04/20 encounter (Appointment) with Dyann Kief, PA-C.     Allergies:   Onion, Pork-derived products, Beef-derived products, Cedar, and Sulfonamide derivatives   Social History   Socioeconomic History  . Marital status: Married    Spouse name: Not on file  . Number of children: Not on file  . Years of education: Not on file  . Highest education level: Not on file  Occupational History  . Occupation: Charity fundraiser  Tobacco Use  . Smoking status: Never Smoker  . Smokeless tobacco: Never Used  Vaping Use  . Vaping Use: Never used  Substance and Sexual Activity  . Alcohol use: Yes    Comment: occcasional  . Drug use: No  . Sexual activity: Yes    Birth control/protection: Surgical    Comment: tubal and ablation  Other Topics Concern  . Not on file  Social History Narrative  . Not on file   Social Determinants of Health   Financial Resource Strain: Low Risk    . Difficulty of Paying Living Expenses: Not hard at all  Food Insecurity: No Food Insecurity  . Worried About Programme researcher, broadcasting/film/video in the Last Year: Never true  . Ran Out of Food in the Last Year: Never true  Transportation Needs: No Transportation Needs  . Lack of Transportation (Medical): No  . Lack of Transportation (Non-Medical): No  Physical Activity: Insufficiently Active  . Days of Exercise per Week: 2 days  . Minutes of Exercise per Session: 40 min  Stress: Stress Concern Present  . Feeling of Stress : Very much  Social Connections: Moderately Integrated  . Frequency of Communication with Friends and Family: More than three times a week  . Frequency of Social Gatherings with Friends and Family: Twice a week  . Attends Religious Services: More than 4 times per year  . Active Member of Clubs or Organizations: No  . Attends Banker Meetings: Never  . Marital Status: Married     Family History:  The patient's ***family history includes Alzheimer's disease in her maternal grandmother and paternal grandmother; Anxiety disorder in her daughter; Cancer in her maternal grandfather and mother; Diverticulitis in her mother; Heart attack in her paternal grandfather; Heart disease in her paternal grandfather; Hyperlipidemia in her brother and father; Hypertension in her father, maternal grandmother, and mother;  Other in her maternal grandmother, paternal grandmother, son, and son; Prostate cancer in her father.   ROS:   Please see the history of present illness.    ROS All other systems reviewed and are negative.   PHYSICAL EXAM:   VS:  There were no vitals taken for this visit.  Physical Exam  GEN: Well nourished, well developed, in no acute distress  HEENT: normal  Neck: no JVD, carotid bruits, or masses Cardiac:RRR; no murmurs, rubs, or gallops  Respiratory:  clear to auscultation bilaterally, normal work of breathing GI: soft, nontender, nondistended, + BS Ext:  without cyanosis, clubbing, or edema, Good distal pulses bilaterally MS: no deformity or atrophy  Skin: warm and dry, no rash Neuro:  Alert and Oriented x 3, Strength and sensation are intact Psych: euthymic mood, full affect  Wt Readings from Last 3 Encounters:  03/21/20 107 lb 6.4 oz (48.7 kg)  03/02/20 162 lb 6.4 oz (73.7 kg)  02/25/20 165 lb (74.8 kg)      Studies/Labs Reviewed:   EKG:  EKG is*** ordered today.  The ekg ordered today demonstrates ***  Recent Labs: No results found for requested labs within last 8760 hours.   Lipid Panel    Component Value Date/Time   CHOL 220 (H) 04/09/2014 1115   TRIG 215.0 (H) 04/09/2014 1115   HDL 35.40 (L) 04/09/2014 1115   CHOLHDL 6 04/09/2014 1115   VLDL 43.0 (H) 04/09/2014 1115   LDLDIRECT 140.0 04/09/2014 1115    Additional studies/ records that were reviewed today include:  ***    ASSESSMENT:    No diagnosis found.   PLAN:  In order of problems listed above:  Palpitations have improved with vagal maneuvers in the past  HTN changed to Chlorthalidone     Medication Adjustments/Labs and Tests Ordered: Current medicines are reviewed at length with the patient today.  Concerns regarding medicines are outlined above.  Medication changes, Labs and Tests ordered today are listed in the Patient Instructions below. There are no Patient Instructions on file for this visit.   Elson Clan, PA-C  03/30/2020 11:55 AM    [facility] [address] [place], [place], [place]  [address] Phone: [phone]; Fax: [phone]

## 2020-04-04 ENCOUNTER — Ambulatory Visit: Payer: No Typology Code available for payment source | Admitting: Physician Assistant

## 2020-04-04 DIAGNOSIS — I10 Essential (primary) hypertension: Secondary | ICD-10-CM

## 2020-04-04 DIAGNOSIS — R002 Palpitations: Secondary | ICD-10-CM

## 2020-04-06 ENCOUNTER — Telehealth: Payer: Self-pay | Admitting: Allergy & Immunology

## 2020-04-06 NOTE — Telephone Encounter (Signed)
Patient is returning a call about blood test results.

## 2020-04-07 ENCOUNTER — Telehealth: Payer: Self-pay

## 2020-04-07 NOTE — Telephone Encounter (Signed)
Thank you, Cree!   Malachi Bonds, MD [facility]

## 2020-04-07 NOTE — Telephone Encounter (Signed)
Called pt. No answer. Left message to call office. 

## 2020-04-07 NOTE — Telephone Encounter (Signed)
Called and left a voicemail asking for patient to return call to go over lab results which are in the result notes in the in basket.

## 2020-04-07 NOTE — Telephone Encounter (Signed)
-----   Message from Alfonse Spruce, MD sent at 04/04/2020  7:11 AM EDT ----- Can someone call patient to let her know that her alpha gal panel is positive?  We can send information to her on the alpha gal allergy if she is interested.  These make sure that she has an epinephrine autoinjector and she should avoid all mammalian meats.  A lot of people do lose the sensitization over time, so we will plan to recheck in 1 year.  Malachi Bonds, MD [facility]

## 2020-04-11 ENCOUNTER — Ambulatory Visit: Payer: No Typology Code available for payment source | Admitting: Adult Health

## 2020-04-12 ENCOUNTER — Ambulatory Visit: Payer: No Typology Code available for payment source | Admitting: Adult Health

## 2020-04-19 NOTE — Progress Notes (Addendum)
Cardiology Office Note    Date:  05/03/2020   ID:  Yun, Gutierrez [birth year], MRN [id number]  PCP:  Annalee Genta, DO  Cardiologist: Dina Rich, MD EPS: None  Chief Complaint  Patient presents with  . Follow-up  . Hypertension    History of Present Illness:  Judy Harrison is a 54 y.o. female with history of hypertension and palpitations that usually improve with vagal maneuvers.  Echo10/2017 normal LVEF 60 to 65% with mild LVH.   Patient last saw Dr. Wyline Mood 10/02/2018 via telemedicine at which time blood pressure was running up with recent ankle injury.  She was supposed to check her blood pressures for 2 weeks and if above 130/80 increase her HCTZ versus metoprolol if worsening palpitations.  Blood pressures continue to run high so HCTZ was stopped and she was started on chlorthalidone 12.5 mg daily.  BP high at OV 04/28/20 and she had run out of chlorthalidone and metoprolol but hasn't gotten it yet. She's been out of it for 5 days. Patient has a lot of stress with work and family. Trying to walk and goes to the gym twice weekly. Doesn't get much salt in her diet. On keto and intermittent fasting diet.   Past Medical History:  Diagnosis Date  . Allergy to alpha-gal   . Anaphylaxis    Onions  . Epidermal cyst 02/08/2014  . Hypertension   . Kidney stone   . Peri-menopausal 02/08/2014  . Supraventricular tachycardia (HCC)   . UTI (lower urinary tract infection) 02/08/2014    Past Surgical History:  Procedure Laterality Date  . CESAREAN SECTION    . ENDOMETRIAL ABLATION    . stent in kidney    . TUBAL LIGATION    . uterine abla      Current Medications: Current Meds  Medication Sig  . albuterol (VENTOLIN HFA) 108 (90 Base) MCG/ACT inhaler Inhale 2 puffs into the lungs every 6 (six) hours as needed for wheezing or shortness of breath.  Marland Kitchen aspirin 81 MG tablet Take 81 mg by mouth daily.  Marland Kitchen azelastine (ASTELIN) 0.1 % nasal spray Place 2 sprays into both nostrils 2  (two) times daily.  . cetirizine (ZYRTEC) 10 MG tablet Take 10 mg by mouth daily as needed for allergies.   . diphenhydrAMINE (BENADRYL) 25 MG tablet Take 1 tablet (25 mg total) by mouth every 6 (six) hours.  Marland Kitchen EPINEPHrine 0.3 mg/0.3 mL IJ SOAJ injection Inject into the muscle once.  Marland Kitchen ibuprofen (ADVIL,MOTRIN) 800 MG tablet Take 800 mg by mouth daily as needed for mild pain or moderate pain.   . metoprolol tartrate (LOPRESSOR) 25 MG tablet Take 1 tablet (25 mg total) by mouth 2 (two) times daily. (BETA BLOCKER) SCHEDULE OV FOR FURTHER REFILLS.  . Multiple Vitamin (MULTIVITAMIN) tablet Take 1 tablet by mouth daily.  Marland Kitchen VITAMIN D PO Take by mouth.  . [DISCONTINUED] metoprolol tartrate (LOPRESSOR) 25 MG tablet Take 1 tablet (25 mg total) by mouth 2 (two) times daily. (BETA BLOCKER) SCHEDULE OV FOR FURTHER REFILLS.     Allergies:   Onion, Pork-derived products, Beef-derived products, Cedar, Other, and Sulfonamide derivatives   Social History   Socioeconomic History  . Marital status: Married    Spouse name: Not on file  . Number of children: Not on file  . Years of education: Not on file  . Highest education level: Not on file  Occupational History  . Occupation: Charity fundraiser  Tobacco Use  . Smoking status:  Never Smoker  . Smokeless tobacco: Never Used  Vaping Use  . Vaping Use: Never used  Substance and Sexual Activity  . Alcohol use: Yes    Comment: occcasional  . Drug use: No  . Sexual activity: Yes    Birth control/protection: Surgical    Comment: tubal and ablation  Other Topics Concern  . Not on file  Social History Narrative  . Not on file   Social Determinants of Health   Financial Resource Strain: Low Risk   . Difficulty of Paying Living Expenses: Not hard at all  Food Insecurity: No Food Insecurity  . Worried About Programme researcher, broadcasting/film/video in the Last Year: Never true  . Ran Out of Food in the Last Year: Never true  Transportation Needs: No Transportation Needs  . Lack of  Transportation (Medical): No  . Lack of Transportation (Non-Medical): No  Physical Activity: Insufficiently Active  . Days of Exercise per Week: 2 days  . Minutes of Exercise per Session: 40 min  Stress: Stress Concern Present  . Feeling of Stress : Very much  Social Connections: Moderately Integrated  . Frequency of Communication with Friends and Family: More than three times a week  . Frequency of Social Gatherings with Friends and Family: Twice a week  . Attends Religious Services: More than 4 times per year  . Active Member of Clubs or Organizations: No  . Attends Banker Meetings: Never  . Marital Status: Married     Family History:  The patient's   family history includes Alzheimer's disease in her maternal grandmother and paternal grandmother; Anxiety disorder in her daughter; Cancer in her maternal grandfather and mother; Diverticulitis in her mother; Heart attack in her paternal grandfather; Heart disease in her paternal grandfather; Hyperlipidemia in her brother and father; Hypertension in her father, maternal grandmother, and mother; Other in her maternal grandmother, paternal grandmother, son, and son; Prostate cancer in her father.   ROS:   Please see the history of present illness.    ROS All other systems reviewed and are negative.   PHYSICAL EXAM:   VS:  BP (!) 160/98   Pulse 70   Ht 5\' 2"  (1.575 m)   Wt 167 lb (75.8 kg)   LMP 09/28/2015   SpO2 98%   BMI 30.54 kg/m   Physical Exam  GEN: Well nourished, well developed, in no acute distress  Neck: no JVD, carotid bruits, or masses Cardiac:RRR; S4,no murmurs, rubs Respiratory:  clear to auscultation bilaterally, normal work of breathing GI: soft, nontender, nondistended, + BS Ext: without cyanosis, clubbing, or edema, Good distal pulses bilaterally Neuro:  Alert and Oriented x 3 Psych: euthymic mood, full affect  Wt Readings from Last 3 Encounters:  05/03/20 167 lb (75.8 kg)  04/28/20 167 lb (75.8  kg)  03/21/20 107 lb 6.4 oz (48.7 kg)      Studies/Labs Reviewed:   EKG:  EKG is  ordered today.  The ekg ordered today demonstrates sinus bradycardia 58/m,  normal EKG  Recent Labs: No results found for requested labs within last 8760 hours.   Lipid Panel    Component Value Date/Time   CHOL 220 (H) 04/09/2014 1115   TRIG 215.0 (H) 04/09/2014 1115   HDL 35.40 (L) 04/09/2014 1115   CHOLHDL 6 04/09/2014 1115   VLDL 43.0 (H) 04/09/2014 1115   LDLDIRECT 140.0 04/09/2014 1115    Additional studies/ records that were reviewed today include:    03/2016 echo Study Conclusions   -  Left ventricle: The cavity size was normal. Wall thickness was   increased in a pattern of mild LVH. Systolic function was normal.   The estimated ejection fraction was in the range of 60% to 65%.   Wall motion was normal; there were no regional wall motion   abnormalities. Left ventricular diastolic function parameters   were normal. - Atrial septum: No defect or patent foramen ovale was identified.   ASSESSMENT:    1. Essential hypertension   2. Palpitations      PLAN:  In order of problems listed above:  HTN BP running high on meds but even higher since she's been out for 5 days. Increase chlorthalidone 25 mg daily, refill metoprolol. Stress also contributing.  Palpitations mostly controlled while on metoprolol.    Medication Adjustments/Labs and Tests Ordered: Current medicines are reviewed at length with the patient today.  Concerns regarding medicines are outlined above.  Medication changes, Labs and Tests ordered today are listed in the Patient Instructions below. Patient Instructions  Medication Instructions:  Your physician has recommended you make the following change in your medication:  Increase Chlorthalidone to 25 mg Daily   *If you need a refill on your cardiac medications before your next appointment, please call your pharmacy*   Lab Work: NONE   If you have labs  (blood work) drawn today and your tests are completely normal, you will receive your results only by: Marland Kitchen MyChart Message (if you have MyChart) OR . A paper copy in the mail If you have any lab test that is abnormal or we need to change your treatment, we will call you to review the results.   Testing/Procedures: Your physician has requested that you regularly monitor and record your blood pressure readings at home. Please use the same machine at the same time of day to check your readings and record them to bring to your follow-up visit.     Follow-Up: At [facility], you and your health needs are our priority.  As part of our continuing mission to provide you with exceptional heart care, we have created designated Provider Care Teams.  These Care Teams include your primary Cardiologist (physician) and Advanced Practice Providers (APPs -  Physician Assistants and Nurse Practitioners) who all work together to provide you with the care you need, when you need it.  We recommend signing up for the patient portal called "MyChart".  Sign up information is provided on this After Visit Summary.  MyChart is used to connect with patients for Virtual Visits (Telemedicine).  Patients are able to view lab/test results, encounter notes, upcoming appointments, etc.  Non-urgent messages can be sent to your provider as well.   To learn more about what you can do with MyChart, go to [email or web address].    Your next appointment:   1 year(s)  The format for your next appointment:   In Person  Provider:   Dina Rich, MD   Other Instructions Thank you for choosing [facility]!       Elson Clan, PA-C  05/03/2020 10:11 AM    [facility] [address] [place], [place], [place]  [address] Phone: [phone]; Fax: [phone]

## 2020-04-28 ENCOUNTER — Other Ambulatory Visit: Payer: Self-pay

## 2020-04-28 ENCOUNTER — Ambulatory Visit (INDEPENDENT_AMBULATORY_CARE_PROVIDER_SITE_OTHER): Payer: No Typology Code available for payment source | Admitting: Adult Health

## 2020-04-28 ENCOUNTER — Encounter: Payer: Self-pay | Admitting: Adult Health

## 2020-04-28 VITALS — BP 161/104 | HR 61 | Ht 62.0 in | Wt 167.0 lb

## 2020-04-28 DIAGNOSIS — F439 Reaction to severe stress, unspecified: Secondary | ICD-10-CM

## 2020-04-28 DIAGNOSIS — I10 Essential (primary) hypertension: Secondary | ICD-10-CM | POA: Diagnosis not present

## 2020-04-28 MED ORDER — PAROXETINE HCL 10 MG PO TABS: 10.0000 mg | ORAL_TABLET | Freq: Every day | ORAL | 2 refills | Status: DC

## 2020-04-28 MED ORDER — METOPROLOL TARTRATE 25 MG PO TABS: 25.0000 mg | ORAL_TABLET | Freq: Two times a day (BID) | ORAL | 0 refills | Status: DC

## 2020-04-28 MED ORDER — CHLORTHALIDONE 25 MG PO TABS: 12.5000 mg | ORAL_TABLET | Freq: Every day | ORAL | 3 refills | Status: DC

## 2020-04-28 NOTE — Progress Notes (Signed)
  Subjective:     Patient ID: Judy Harrison, female   DOB: [birth year], 54 y.o.   MRN: [id number]  HPI Judy Harrison is a 54 year old white female,married, [id number], in for follow up on BP, is out of meds, +stress at home and work. She has kept check on BP at work and lowest was 130/80, with range 150-182/80-108. She is trying to take time for self and lose some weight that she gained back.  PCP is Dr Ladona Ridgel.  Review of Systems +stress Reviewed past medical,surgical, social and family history. Reviewed medications and allergies.     Objective:   Physical Exam BP (!) 161/104 (BP Location: Right Arm, Patient Position: Sitting, Cuff Size: Normal)   Pulse 61   Ht 5\' 2"  (1.575 m)   Wt 167 lb (75.8 kg)   LMP 09/28/2015   BMI 30.54 kg/m  Skin warm and dry.Lungs: clear to ausculation bilaterally. Cardiovascular: regular rate and rhythm.     Upstream - 04/28/20 1112      Pregnancy Intention Screening   Does the patient want to become pregnant in the next year? N/A    Does the patient's partner want to become pregnant in the next year? N/A    Would the patient like to discuss contraceptive options today? N/A      Contraception Wrap Up   Current Method Female Sterilization    End Method Female Sterilization    Contraception Counseling Provided No          Assessment:     1. Hypertension, unspecified type Will refill chlorthalidone and metoprolol, is out  Meds ordered this encounter  Medications  . chlorthalidone (HYGROTON) 25 MG tablet    Sig: Take 0.5 tablets (12.5 mg total) by mouth daily.    Dispense:  45 tablet    Refill:  3    Order Specific Question:   Supervising Provider    Answer:   04/30/20, LUTHER H [[id number]]  . metoprolol tartrate (LOPRESSOR) 25 MG tablet    Sig: Take 1 tablet (25 mg total) by mouth 2 (two) times daily. (BETA BLOCKER) SCHEDULE OV FOR FURTHER REFILLS.    Dispense:  30 tablet    Refill:  0    Order Specific Question:   Supervising Provider    Answer:   Despina Hidden, LUTHER  H [[id number]]  . PARoxetine (PAXIL) 10 MG tablet    Sig: Take 1 tablet (10 mg total) by mouth daily.    Dispense:  30 tablet    Refill:  2    Order Specific Question:   Supervising Provider    Answer:   Despina Hidden [[id number]]  She has appointment with cardiologist 05/03/20.  2. Stress Will try Paxil     Plan:     Follow up in 6 weeks to see if Paxil helped

## 2020-05-03 ENCOUNTER — Encounter: Payer: Self-pay | Admitting: Physician Assistant

## 2020-05-03 ENCOUNTER — Other Ambulatory Visit: Payer: Self-pay | Admitting: Physician Assistant

## 2020-05-03 ENCOUNTER — Ambulatory Visit: Payer: No Typology Code available for payment source | Admitting: Physician Assistant

## 2020-05-03 ENCOUNTER — Other Ambulatory Visit: Payer: Self-pay

## 2020-05-03 VITALS — BP 160/98 | HR 70 | Ht 62.0 in | Wt 167.0 lb

## 2020-05-03 DIAGNOSIS — I10 Essential (primary) hypertension: Secondary | ICD-10-CM

## 2020-05-03 DIAGNOSIS — R002 Palpitations: Secondary | ICD-10-CM | POA: Diagnosis not present

## 2020-05-03 MED ORDER — METOPROLOL TARTRATE 25 MG PO TABS: 25.0000 mg | ORAL_TABLET | Freq: Two times a day (BID) | ORAL | 3 refills | Status: DC

## 2020-05-03 MED ORDER — CHLORTHALIDONE 25 MG PO TABS: 25.0000 mg | ORAL_TABLET | Freq: Every day | ORAL | 3 refills | Status: DC

## 2020-05-03 NOTE — Addendum Note (Signed)
Addended by: Kerney Elbe on: 05/03/2020 10:59 AM   Modules accepted: Orders

## 2020-05-03 NOTE — Patient Instructions (Signed)
Medication Instructions:  Your physician has recommended you make the following change in your medication:  Increase Chlorthalidone to 25 mg Daily   *If you need a refill on your cardiac medications before your next appointment, please call your pharmacy*   Lab Work: NONE   If you have labs (blood work) drawn today and your tests are completely normal, you will receive your results only by: Marland Kitchen MyChart Message (if you have MyChart) OR . A paper copy in the mail If you have any lab test that is abnormal or we need to change your treatment, we will call you to review the results.   Testing/Procedures: Your physician has requested that you regularly monitor and record your blood pressure readings at home. Please use the same machine at the same time of day to check your readings and record them to bring to your follow-up visit.     Follow-Up: At [facility], you and your health needs are our priority.  As part of our continuing mission to provide you with exceptional heart care, we have created designated Provider Care Teams.  These Care Teams include your primary Cardiologist (physician) and Advanced Practice Providers (APPs -  Physician Assistants and Nurse Practitioners) who all work together to provide you with the care you need, when you need it.  We recommend signing up for the patient portal called "MyChart".  Sign up information is provided on this After Visit Summary.  MyChart is used to connect with patients for Virtual Visits (Telemedicine).  Patients are able to view lab/test results, encounter notes, upcoming appointments, etc.  Non-urgent messages can be sent to your provider as well.   To learn more about what you can do with MyChart, go to [email or web address].    Your next appointment:   1 year(s)  The format for your next appointment:   In Person  Provider:   Dina Rich, MD   Other Instructions Thank you for choosing [facility]!

## 2020-05-09 MED FILL — METOPROLOL TARTRATE 25 MG T: 25 | 90 days supply | Qty: 180 | Fill #0

## 2020-05-09 MED FILL — CHLORTHALIDONE 25 MG TABS: 25 | 90 days supply | Qty: 90 | Fill #0

## 2020-06-09 ENCOUNTER — Telehealth (INDEPENDENT_AMBULATORY_CARE_PROVIDER_SITE_OTHER): Payer: No Typology Code available for payment source | Admitting: Adult Health

## 2020-06-09 ENCOUNTER — Encounter: Payer: Self-pay | Admitting: Adult Health

## 2020-06-09 VITALS — BP 138/91 | HR 77 | Ht 62.5 in

## 2020-06-09 DIAGNOSIS — I10 Essential (primary) hypertension: Secondary | ICD-10-CM | POA: Diagnosis not present

## 2020-06-09 NOTE — Progress Notes (Addendum)
Patient ID: Judy Harrison, female   DOB: [birth year], 54 y.o.   MRN: [id number]   TELEHEALTH GYNECOLOGY VISIT ENCOUNTER NOTE  I connected with Judy Harrison on 06/09/20 at  9:50 AM EST by telephone at home and verified that I am speaking with the correct person using two identifiers. Patient was unable to do MyChart audiovisual encounter due to technical difficulties, she tried several times.    I discussed the limitations, risks, security and privacy concerns of performing an evaluation and management service by telephone and the availability of in person appointments. I also discussed with the patient that there may be a patient responsible charge related to this service. The patient expressed understanding and agreed to proceed.   History:  Judy Harrison is a 54 y.o. [phone] female being evaluated today for BP and stress. She has cold and sinus issues.  She has seen cardiologist and he adjusted BP meds. She denies any depression and stress is OK did not start paxil, or other concerns.    She has just learned she is going to be Grandma.   Past Medical History:  Diagnosis Date  . Allergy to alpha-gal   . Anaphylaxis    Onions  . Epidermal cyst 02/08/2014  . Hypertension   . Kidney stone   . Peri-menopausal 02/08/2014  . Supraventricular tachycardia (HCC)   . UTI (lower urinary tract infection) 02/08/2014   Past Surgical History:  Procedure Laterality Date  . CESAREAN SECTION    . ENDOMETRIAL ABLATION    . stent in kidney    . TUBAL LIGATION    . uterine abla     The following portions of the patient's history were reviewed and updated as appropriate: allergies, current medications, past family history, past medical history, past social history, past surgical history and problem list.   Health Maintenance:  Normal pap and negative HRHPV on 02/25/20.   Review of Systems:  Pertinent items noted in HPI and remainder of comprehensive ROS otherwise negative.  Physical Exam:   General:   Alert, oriented and cooperative.   Mental Status: Normal mood and affect perceived. Normal judgment and thought content.  Physical exam deferred due to nature of the encounter BP (!) 138/91   Pulse 77   Ht 5' 2.5" (1.588 m)   LMP 09/28/2015   BMI 30.06 kg/m PHQ 9 score is 2  Fall risk is low   Labs and Imaging No results found for this or any previous visit (from the past 336 hour(s)). No results found.    Assessment and Plan:     1. Hypertension, unspecified type Continue meds, has refills     Follow up in 3 months    I discussed the assessment and treatment plan with the patient. The patient was provided an opportunity to ask questions and all were answered. The patient agreed with the plan and demonstrated an understanding of the instructions.   The patient was advised to call back or seek an in-person evaluation/go to the ED if the symptoms worsen or if the condition fails to improve as anticipated.  I provided 12 minutes of non-face-to-face time during this encounter. I was in my office at [facility], for this visit.   Cyril Mourning, NP [facility] [employer], [facility]

## 2020-08-18 MED FILL — CHLORTHALIDONE 25 MG TABS: 25 | 90 days supply | Qty: 90 | Fill #1

## 2020-09-10 ENCOUNTER — Other Ambulatory Visit (HOSPITAL_COMMUNITY): Payer: Self-pay

## 2020-09-20 ENCOUNTER — Other Ambulatory Visit (HOSPITAL_COMMUNITY): Payer: Self-pay

## 2020-10-06 ENCOUNTER — Other Ambulatory Visit (HOSPITAL_COMMUNITY): Payer: Self-pay

## 2020-10-06 MED FILL — Metoprolol Tartrate Tab 25 MG: ORAL | 90 days supply | Qty: 180 | Fill #0 | Status: CN

## 2020-10-14 ENCOUNTER — Other Ambulatory Visit (HOSPITAL_COMMUNITY): Payer: Self-pay

## 2020-10-19 ENCOUNTER — Other Ambulatory Visit (HOSPITAL_COMMUNITY): Payer: Self-pay

## 2020-10-19 MED FILL — Metoprolol Tartrate Tab 25 MG: ORAL | 90 days supply | Qty: 180 | Fill #0 | Status: AC

## 2020-11-23 ENCOUNTER — Other Ambulatory Visit (HOSPITAL_COMMUNITY): Payer: Self-pay

## 2020-11-23 MED FILL — Chlorthalidone Tab 25 MG: ORAL | 90 days supply | Qty: 90 | Fill #0 | Status: AC

## 2021-02-24 ENCOUNTER — Other Ambulatory Visit (HOSPITAL_COMMUNITY): Payer: Self-pay

## 2021-02-24 ENCOUNTER — Other Ambulatory Visit: Payer: Self-pay | Admitting: Physician Assistant

## 2021-02-24 MED ORDER — METOPROLOL TARTRATE 25 MG PO TABS
25.0000 mg | ORAL_TABLET | Freq: Two times a day (BID) | ORAL | 3 refills | Status: DC
  Filled 2021-02-24: qty 180, 90d supply, fill #0
  Filled 2021-07-28: qty 180, 90d supply, fill #1

## 2021-02-24 MED FILL — Chlorthalidone Tab 25 MG: ORAL | 90 days supply | Qty: 90 | Fill #1 | Status: AC

## 2021-02-26 ENCOUNTER — Other Ambulatory Visit: Payer: Self-pay | Admitting: Adult Health

## 2021-02-27 ENCOUNTER — Other Ambulatory Visit (HOSPITAL_COMMUNITY): Payer: Self-pay

## 2021-04-21 ENCOUNTER — Encounter: Payer: Self-pay | Admitting: Nurse Practitioner

## 2021-05-23 ENCOUNTER — Other Ambulatory Visit: Payer: Self-pay | Admitting: Physician Assistant

## 2021-05-23 ENCOUNTER — Other Ambulatory Visit (HOSPITAL_COMMUNITY): Payer: Self-pay

## 2021-05-23 MED ORDER — CHLORTHALIDONE 25 MG PO TABS
25.0000 mg | ORAL_TABLET | Freq: Every day | ORAL | 3 refills | Status: DC
  Filled 2021-05-23: qty 90, 90d supply, fill #0
  Filled 2021-08-21: qty 90, 90d supply, fill #1
  Filled 2021-11-14: qty 90, 90d supply, fill #2

## 2021-07-28 ENCOUNTER — Other Ambulatory Visit (HOSPITAL_COMMUNITY): Payer: Self-pay

## 2021-08-21 ENCOUNTER — Other Ambulatory Visit (HOSPITAL_COMMUNITY): Payer: Self-pay

## 2021-11-14 ENCOUNTER — Other Ambulatory Visit (HOSPITAL_COMMUNITY): Payer: Self-pay

## 2021-12-26 ENCOUNTER — Other Ambulatory Visit: Payer: Self-pay | Admitting: Adult Health

## 2021-12-26 DIAGNOSIS — Z1231 Encounter for screening mammogram for malignant neoplasm of breast: Secondary | ICD-10-CM

## 2021-12-28 ENCOUNTER — Ambulatory Visit (INDEPENDENT_AMBULATORY_CARE_PROVIDER_SITE_OTHER): Payer: No Typology Code available for payment source | Admitting: Family Medicine

## 2021-12-28 ENCOUNTER — Encounter: Payer: Self-pay | Admitting: Family Medicine

## 2021-12-28 ENCOUNTER — Encounter (INDEPENDENT_AMBULATORY_CARE_PROVIDER_SITE_OTHER): Payer: Self-pay | Admitting: *Deleted

## 2021-12-28 VITALS — BP 158/84 | HR 65 | Temp 99.1°F | Ht 61.5 in | Wt 162.2 lb

## 2021-12-28 DIAGNOSIS — J4530 Mild persistent asthma, uncomplicated: Secondary | ICD-10-CM

## 2021-12-28 DIAGNOSIS — T781XXA Other adverse food reactions, not elsewhere classified, initial encounter: Secondary | ICD-10-CM

## 2021-12-28 DIAGNOSIS — E785 Hyperlipidemia, unspecified: Secondary | ICD-10-CM | POA: Insufficient documentation

## 2021-12-28 DIAGNOSIS — I10 Essential (primary) hypertension: Secondary | ICD-10-CM

## 2021-12-28 DIAGNOSIS — Z91018 Allergy to other foods: Secondary | ICD-10-CM

## 2021-12-28 DIAGNOSIS — Z8249 Family history of ischemic heart disease and other diseases of the circulatory system: Secondary | ICD-10-CM

## 2021-12-28 DIAGNOSIS — Z130 Encounter for screening for diseases of the blood and blood-forming organs and certain disorders involving the immune mechanism: Secondary | ICD-10-CM

## 2021-12-28 DIAGNOSIS — R739 Hyperglycemia, unspecified: Secondary | ICD-10-CM

## 2021-12-28 DIAGNOSIS — Z8679 Personal history of other diseases of the circulatory system: Secondary | ICD-10-CM | POA: Insufficient documentation

## 2021-12-28 DIAGNOSIS — Z1211 Encounter for screening for malignant neoplasm of colon: Secondary | ICD-10-CM

## 2021-12-28 DIAGNOSIS — J45909 Unspecified asthma, uncomplicated: Secondary | ICD-10-CM | POA: Insufficient documentation

## 2021-12-28 MED ORDER — AMLODIPINE BESYLATE 5 MG PO TABS
5.0000 mg | ORAL_TABLET | Freq: Every day | ORAL | 3 refills | Status: DC
  Filled 2022-01-30: qty 90, 90d supply, fill #0
  Filled 2022-05-06: qty 90, 90d supply, fill #1
  Filled 2022-08-09: qty 90, 90d supply, fill #2
  Filled 2022-11-09: qty 60, 60d supply, fill #3

## 2021-12-28 MED ORDER — FLUTICASONE FUROATE-VILANTEROL 200-25 MCG/ACT IN AEPB
1.0000 | INHALATION_SPRAY | Freq: Every day | RESPIRATORY_TRACT | 12 refills | Status: DC
  Filled 2022-01-30: qty 1, 1d supply, fill #0

## 2021-12-28 MED ORDER — ALBUTEROL SULFATE HFA 108 (90 BASE) MCG/ACT IN AERS: 2.0000 | INHALATION_SPRAY | Freq: Four times a day (QID) | RESPIRATORY_TRACT | 2 refills | Status: AC | PRN

## 2021-12-28 MED ORDER — EPINEPHRINE 0.3 MG/0.3ML IJ SOAJ
0.3000 mg | INTRAMUSCULAR | 3 refills | Status: DC | PRN
Start: 2021-12-28 — End: 2023-09-25

## 2021-12-28 NOTE — Assessment & Plan Note (Signed)
Uncontrolled.  Albuterol refilled.  Starting on Breo.

## 2021-12-28 NOTE — Assessment & Plan Note (Signed)
Patient endorses a family history of heart disease.  She has risk factors and requested CT coronary calcium screening.  She is willing to pay out-of-pocket.  No chest pain.

## 2021-12-28 NOTE — Assessment & Plan Note (Signed)
Alpha gal testing ordered.

## 2021-12-28 NOTE — Assessment & Plan Note (Signed)
Uncontrolled.  Continue chlorthalidone.  Adding amlodipine.

## 2021-12-28 NOTE — Progress Notes (Signed)
Subjective:  Patient ID: Judy Harrison, female    DOB: 1965-11-21  Age: 56 y.o. MRN: [id number]  CC: Chief Complaint  Patient presents with   Establish Care    Would like Alpha Gal retested Cardiolgy for SVT-Dr.Branch. will be seeing PA in Sept. Pt under stress at work; BP has been fluctuating. Mild asthma per Dr.Gallagher; has noticed allergies and asthma have worsened(has had some wheezing)    HPI:  56 year old female with hypertension, allergy to alpha gal, history of palpitations/SVT, asthma, allergic rhinitis presents to establish care.  Patient has hypertension.  Uncontrolled.  Blood pressure elevated here today and on repeat.  She is currently on chlorthalidone 25 mg daily and metoprolol 25 mg twice daily.  She attributes a lot of her issues with hypertension to stress at work.  Patient states that as of late she is having issues with her asthma.  Has had some chest tightness and wheezing.  Uses albuterol infrequently.  Needs refill on albuterol.  We will discuss controller medication given recent trouble.  Patient has history of alpha gal.  Would like repeat testing.  Patient will have a mammogram in August.  Endorses a family history of breast cancer.  She is overdue for colonoscopy.  Is amenable to referral.  Additionally, patient states that she has a family history of heart disease.  She has hypertension and hyperlipidemia.  She would like to discuss CT coronary calcium.  Patient Active Problem List   Diagnosis Date Noted   History of supraventricular tachycardia 12/28/2021   Asthma 12/28/2021   Allergy to alpha-gal 12/28/2021   Family history of heart disease 12/28/2021   Hyperlipidemia 12/28/2021   Seasonal and perennial allergic rhinitis 03/02/2020   Food intolerance 03/02/2020   Hypertension 02/25/2020    Social Hx   Social History   Socioeconomic History   Marital status: Married    Spouse name: Not on file   Number of children: Not on file   Years of  education: Not on file   Highest education level: Not on file  Occupational History   Occupation: RN  Tobacco Use   Smoking status: Never   Smokeless tobacco: Never  Vaping Use   Vaping Use: Never used  Substance and Sexual Activity   Alcohol use: Yes    Comment: occcasional   Drug use: No   Sexual activity: Yes    Birth control/protection: Surgical    Comment: tubal and ablation  Other Topics Concern   Not on file  Social History Narrative   Not on file   Social Determinants of Health   Financial Resource Strain: Low Risk  (02/25/2020)   Overall Financial Resource Strain (CARDIA)    Difficulty of Paying Living Expenses: Not hard at all  Food Insecurity: No Food Insecurity (02/25/2020)   Hunger Vital Sign    Worried About Running Out of Food in the Last Year: Never true    Ran Out of Food in the Last Year: Never true  Transportation Needs: No Transportation Needs (02/25/2020)   PRAPARE - Hydrologist (Medical): No    Lack of Transportation (Non-Medical): No  Physical Activity: Insufficiently Active (02/25/2020)   Exercise Vital Sign    Days of Exercise per Week: 2 days    Minutes of Exercise per Session: 40 min  Stress: Stress Concern Present (02/25/2020)   [place]    Feeling of Stress : Very much  Social Connections:  Moderately Integrated (02/25/2020)   Social Connection and Isolation Panel [NHANES]    Frequency of Communication with Friends and Family: More than three times a week    Frequency of Social Gatherings with Friends and Family: Twice a week    Attends Religious Services: More than 4 times per year    Active Member of [employer] or Organizations: No    Attends Music therapist: Never    Marital Status: Married    Review of Systems Per HPI  Objective:  BP (!) 158/84   Pulse 65   Temp 99.1 F (37.3 C)   Ht 5' 1.5" (1.562 m)   Wt 162 lb 3.2 oz (73.6  kg)   LMP 09/28/2015   SpO2 98%   BMI 30.15 kg/m      12/28/2021   10:19 AM 12/28/2021    9:40 AM 06/09/2020   10:12 AM  BP/Weight  Systolic BP 093 235 573  Diastolic BP 84 220 91  Wt. (Lbs)  162.2   BMI  30.15 kg/m2     Physical Exam Vitals and nursing note reviewed.  Constitutional:      General: She is not in acute distress.    Appearance: Normal appearance. She is not ill-appearing.  HENT:     Head: Normocephalic and atraumatic.  Eyes:     General:        Right eye: No discharge.        Left eye: No discharge.     Conjunctiva/sclera: Conjunctivae normal.  Cardiovascular:     Rate and Rhythm: Normal rate and regular rhythm.     Heart sounds: No murmur heard. Pulmonary:     Effort: Pulmonary effort is normal.     Breath sounds: Wheezing present.  Neurological:     Mental Status: She is alert.  Psychiatric:        Mood and Affect: Mood normal.        Behavior: Behavior normal.     Lab Results  Component Value Date   WBC 6.6 02/12/2019   HGB 13.5 02/12/2019   HCT 39.9 02/12/2019   PLT 274 02/12/2019   GLUCOSE 92 02/12/2019   CHOL 220 (H) 04/09/2014   TRIG 215.0 (H) 04/09/2014   HDL 35.40 (L) 04/09/2014   LDLDIRECT 140.0 04/09/2014   ALT 31 03/10/2014   AST 27 03/10/2014   NA 139 02/12/2019   K 3.7 02/12/2019   CL 100 02/12/2019   CREATININE 0.66 02/12/2019   BUN 13 02/12/2019   CO2 21 02/12/2019   TSH 3.70 03/22/2016   INR 1.1 04/16/2008   HGBA1C 6.0 (H) 03/22/2016     Assessment & Plan:   Problem List Items Addressed This Visit       Cardiovascular and Mediastinum   Hypertension - Primary    Uncontrolled.  Continue chlorthalidone.  Adding amlodipine.      Relevant Medications   EPINEPHrine 0.3 mg/0.3 mL IJ SOAJ injection   amLODipine (NORVASC) 5 MG tablet   Other Relevant Orders   CMP14+EGFR   CT CARDIAC SCORING (SELF PAY ONLY)     Respiratory   Asthma    Uncontrolled.  Albuterol refilled.  Starting on Breo.      Relevant  Medications   albuterol (VENTOLIN HFA) 108 (90 Base) MCG/ACT inhaler   fluticasone furoate-vilanterol (BREO ELLIPTA) 200-25 MCG/ACT AEPB     Other   Allergy to alpha-gal    Alpha gal testing ordered.      Family history of heart  disease    Patient endorses a family history of heart disease.  She has risk factors and requested CT coronary calcium screening.  She is willing to pay out-of-pocket.  No chest pain.      Relevant Orders   CT CARDIAC SCORING (SELF PAY ONLY)   Hyperlipidemia   Relevant Medications   EPINEPHrine 0.3 mg/0.3 mL IJ SOAJ injection   amLODipine (NORVASC) 5 MG tablet   Other Relevant Orders   Lipid panel   Other Visit Diagnoses     Allergic reaction to alpha-gal       Relevant Orders   Alpha-Gal Panel   Screening for deficiency anemia       Relevant Orders   CBC   Blood glucose elevated       Relevant Orders   Hemoglobin A1c   Colon cancer screening       Relevant Orders   Ambulatory referral to Gastroenterology       Meds ordered this encounter  Medications   albuterol (VENTOLIN HFA) 108 (90 Base) MCG/ACT inhaler    Sig: Inhale 2 puffs into the lungs every 6 (six) hours as needed for wheezing or shortness of breath.    Dispense:  8 g    Refill:  2   EPINEPHrine 0.3 mg/0.3 mL IJ SOAJ injection    Sig: Inject 0.3 mg into the muscle as needed for anaphylaxis.    Dispense:  1 each    Refill:  3   fluticasone furoate-vilanterol (BREO ELLIPTA) 200-25 MCG/ACT AEPB    Sig: Inhale 1 puff into the lungs daily.    Dispense:  1 each    Refill:  11   amLODipine (NORVASC) 5 MG tablet    Sig: Take 1 tablet (5 mg total) by mouth daily.    Dispense:  90 tablet    Refill:  3    Follow-up:  6 months.   [place]andan

## 2021-12-28 NOTE — Patient Instructions (Signed)
If BP remains elevated, you will need additional medication.  Labs today.  Follow up in 6 months.

## 2021-12-29 ENCOUNTER — Other Ambulatory Visit: Payer: Self-pay

## 2022-01-01 ENCOUNTER — Other Ambulatory Visit: Payer: Self-pay

## 2022-01-01 LAB — CMP14+EGFR
ALT: 53 IU/L — ABNORMAL HIGH (ref 0–32)
AST: 30 IU/L (ref 0–40)
Albumin/Globulin Ratio: 2.1 (ref 1.2–2.2)
Albumin: 4.6 g/dL (ref 3.8–4.9)
Alkaline Phosphatase: 76 IU/L (ref 44–121)
BUN/Creatinine Ratio: 17 (ref 9–23)
BUN: 13 mg/dL (ref 6–24)
Bilirubin Total: 0.4 mg/dL (ref 0.0–1.2)
CO2: 27 mmol/L (ref 20–29)
Calcium: 10.8 mg/dL — ABNORMAL HIGH (ref 8.7–10.2)
Chloride: 94 mmol/L — ABNORMAL LOW (ref 96–106)
Creatinine, Ser: 0.76 mg/dL (ref 0.57–1.00)
Globulin, Total: 2.2 g/dL (ref 1.5–4.5)
Glucose: 169 mg/dL — ABNORMAL HIGH (ref 70–99)
Potassium: 3.4 mmol/L — ABNORMAL LOW (ref 3.5–5.2)
Sodium: 138 mmol/L (ref 134–144)
Total Protein: 6.8 g/dL (ref 6.0–8.5)
eGFR: 92 mL/min/{1.73_m2} (ref >59)

## 2022-01-01 LAB — CBC
Hematocrit: 43.2 % (ref 34.0–46.6)
Hemoglobin: 14.5 g/dL (ref 11.1–15.9)
MCH: 29.8 pg (ref 26.6–33.0)
MCHC: 33.6 g/dL (ref 31.5–35.7)
MCV: 89 fL (ref 79–97)
Platelets: 248 10*3/uL (ref 150–450)
RBC: 4.87 x10E6/uL (ref 3.77–5.28)
RDW: 13.8 % (ref 11.7–15.4)
WBC: 6.8 10*3/uL (ref 3.4–10.8)

## 2022-01-01 LAB — LIPID PANEL
Chol/HDL Ratio: 7.9 ratio — ABNORMAL HIGH (ref 0.0–4.4)
Cholesterol, Total: 284 mg/dL — ABNORMAL HIGH (ref 100–199)
HDL: 36 mg/dL — ABNORMAL LOW (ref >39)
LDL Chol Calc (NIH): 155 mg/dL — ABNORMAL HIGH (ref 0–99)
Triglycerides: 484 mg/dL — ABNORMAL HIGH (ref 0–149)
VLDL Cholesterol Cal: 93 mg/dL — ABNORMAL HIGH (ref 5–40)

## 2022-01-01 LAB — ALPHA-GAL PANEL
Allergen Lamb IgE: 0.97 kU/L — AB
Beef IgE: 1.4 kU/L — AB
IgE (Immunoglobulin E), Serum: 59 IU/mL (ref 6–495)
O215-IgE Alpha-Gal: 4.09 kU/L — AB
Pork IgE: 0.91 kU/L — AB

## 2022-01-01 LAB — HEMOGLOBIN A1C
Est. average glucose Bld gHb Est-mCnc: 151 mg/dL
Hgb A1c MFr Bld: 6.9 % — ABNORMAL HIGH (ref 4.8–5.6)

## 2022-01-05 ENCOUNTER — Telehealth: Payer: Self-pay | Admitting: *Deleted

## 2022-01-05 ENCOUNTER — Other Ambulatory Visit: Payer: Self-pay | Admitting: Nurse Practitioner

## 2022-01-05 ENCOUNTER — Telehealth: Payer: Self-pay | Admitting: Cardiology

## 2022-01-05 ENCOUNTER — Other Ambulatory Visit: Payer: Self-pay | Admitting: Physician Assistant

## 2022-01-05 ENCOUNTER — Other Ambulatory Visit (HOSPITAL_COMMUNITY): Payer: Self-pay

## 2022-01-05 MED ORDER — TRIAMCINOLONE ACETONIDE 0.1 % EX CREA: 1.0000 | TOPICAL_CREAM | Freq: Two times a day (BID) | CUTANEOUS | 0 refills | Status: AC

## 2022-01-05 MED ORDER — METOPROLOL TARTRATE 25 MG PO TABS
25.0000 mg | ORAL_TABLET | Freq: Two times a day (BID) | ORAL | 0 refills | Status: DC
  Filled 2022-01-05: qty 90, 45d supply, fill #0

## 2022-01-05 MED ORDER — PREDNISONE 20 MG PO TABS: ORAL_TABLET | ORAL | 0 refills | Status: DC

## 2022-01-05 NOTE — Telephone Encounter (Signed)
*  STAT* If patient is at the pharmacy, call can be transferred to refill team.   1. Which medications need to be refilled? (please list name of each medication and dose if known) metoprolol tartrate (LOPRESSOR) 25 MG tablet  2. Which pharmacy/location (including street and city if local pharmacy) is medication to be sent to?[facility]  3. Do they need a 30 day or 90 day supply? 8  Patient is going out of town tomorrow and needs mediation today.

## 2022-01-05 NOTE — Telephone Encounter (Signed)
Campbell Riches, NP     Done

## 2022-01-05 NOTE — Telephone Encounter (Signed)
Completed.

## 2022-01-05 NOTE — Telephone Encounter (Signed)
Patient calls with [place] -going out of town tomorrow to [place] and would like script for prednisone and steroid cream if possible  [facility]

## 2022-01-18 ENCOUNTER — Other Ambulatory Visit (INDEPENDENT_AMBULATORY_CARE_PROVIDER_SITE_OTHER): Payer: Self-pay

## 2022-01-18 ENCOUNTER — Telehealth (INDEPENDENT_AMBULATORY_CARE_PROVIDER_SITE_OTHER): Payer: Self-pay

## 2022-01-18 DIAGNOSIS — Z1211 Encounter for screening for malignant neoplasm of colon: Secondary | ICD-10-CM

## 2022-01-18 NOTE — Telephone Encounter (Signed)
Referring MD/PCP: Dr Everlene Other  Procedure: Tcs  Reason/Indication:  Screening   Has patient had this procedure before?  No   If so, when, by whom and where?    Is there a family history of colon cancer?  No  Who?  What age when diagnosed?    Is patient diabetic? If yes, Type 1 or Type 2   No       Does patient have prosthetic heart valve or mechanical valve?  No   Do you have a pacemaker/defibrillator?  No   Has patient ever had endocarditis/atrial fibrillation? No   Does patient use oxygen? No   Has patient had joint replacement within last 12 months?  No  Is patient constipated or do they take laxatives? Yes   Does patient have a history of alcohol/drug use?  No   Have you had a stroke/heart attack last 6 mths? No   Do you take medicine for weight loss?  No   For female patients,: have you had a hysterectomy no                       are you post menopausal yes                       do you still have your menstrual cycle no  Is patient on blood thinner such as Coumadin, Plavix and/or Aspirin? Yes   Medications: asa 81 mg daily, Brwo 200/25 mcg daily, albuterol inhaler prn, amlodipine 5 mg daily, chlorthalidone 25 mg daily, metoprolol 25 mg 2 daily, zrytec 10 mg daily, MVI daily Hair/Skin/Nails vit daily, Biotin 2500 mcg daily   Allergies: Sulfa Drugs  Medication Adjustment per Dr Levon Hedger None   Procedure date & time: 02/15/22 at 930

## 2022-01-19 ENCOUNTER — Ambulatory Visit
Admission: RE | Admit: 2022-01-19 | Discharge: 2022-01-19 | Disposition: A | Discharge status: Home / Self Care | Payer: No Typology Code available for payment source | Source: Ambulatory Visit | Attending: Adult Health | Admitting: Adult Health

## 2022-01-19 ENCOUNTER — Encounter (INDEPENDENT_AMBULATORY_CARE_PROVIDER_SITE_OTHER): Payer: Self-pay

## 2022-01-19 ENCOUNTER — Telehealth (INDEPENDENT_AMBULATORY_CARE_PROVIDER_SITE_OTHER): Payer: Self-pay

## 2022-01-19 ENCOUNTER — Other Ambulatory Visit (HOSPITAL_COMMUNITY): Payer: Self-pay

## 2022-01-19 DIAGNOSIS — Z1231 Encounter for screening mammogram for malignant neoplasm of breast: Secondary | ICD-10-CM

## 2022-01-19 MED ORDER — PEG 3350-KCL-NA BICARB-NACL 420 G PO SOLR
4000.0000 mL | ORAL | 0 refills | Status: DC
  Filled 2022-01-19: qty 4000, 1d supply, fill #0

## 2022-01-19 NOTE — Telephone Encounter (Signed)
Xia Stohr Ann Ayanni Tun, CMA  ?

## 2022-01-30 ENCOUNTER — Other Ambulatory Visit (HOSPITAL_COMMUNITY): Payer: Self-pay

## 2022-01-30 MED ORDER — FLUTICASONE FUROATE-VILANTEROL 200-25 MCG/ACT IN AEPB
1.0000 | INHALATION_SPRAY | Freq: Every day | RESPIRATORY_TRACT | 12 refills | Status: DC
  Filled 2022-01-30: qty 60, 30d supply, fill #0
  Filled 2022-03-07: qty 60, 30d supply, fill #1
  Filled 2022-04-10: qty 60, 30d supply, fill #2
  Filled 2022-05-14: qty 60, 30d supply, fill #3
  Filled 2022-06-13: qty 60, 30d supply, fill #4
  Filled 2022-07-27: qty 60, 30d supply, fill #5
  Filled 2022-09-14: qty 60, 30d supply, fill #6
  Filled 2022-10-18: qty 60, 30d supply, fill #7
  Filled 2022-11-29 (×2): qty 60, 30d supply, fill #8

## 2022-02-02 ENCOUNTER — Other Ambulatory Visit (HOSPITAL_COMMUNITY): Payer: No Typology Code available for payment source

## 2022-02-09 NOTE — Patient Instructions (Signed)
Judy Harrison  02/09/2022     @PREFPERIOPPHARMACY @   Your procedure is scheduled on  02/15/2022.   Report to 04/17/2022 at  0800 A.M.   Call this number if you have problems the morning of surgery:  [phone]   Remember:  Follow the diet and prep instructions given to you by the office.      Use your inhalers before you come and bring your rescue inhaler with you.     Take these medicines the morning of surgery with A SIP OF WATER                                              norvasc, lopressor.     Do not wear jewelry, make-up or nail polish.  Do not wear lotions, powders, or perfumes, or deodorant.  Do not shave 48 hours prior to surgery.  Men may shave face and neck.  Do not bring valuables to the hospital.  [facility] is not responsible for any belongings or valuables.  Contacts, dentures or bridgework may not be worn into surgery.  Leave your suitcase in the car.  After surgery it may be brought to your room.  For patients admitted to the hospital, discharge time will be determined by your treatment team.  Patients discharged the day of surgery will not be allowed to drive home and must have someone with them for 24 hours.    Special instructions:   DO NOT smoke tobacco or vape for 24 hours before your procedure.   Please read over the following fact sheets that you were given. Anesthesia Post-op Instructions and Care and Recovery After Surgery      Colonoscopy, Adult, Care After The following information offers guidance on how to care for yourself after your procedure. Your health care provider may also give you more specific instructions. If you have problems or questions, contact your health care provider. What can I expect after the procedure? After the procedure, it is common to have: A small amount of blood in your stool for 24 hours after the procedure. Some gas. Mild cramping or bloating of your abdomen. Follow these instructions at  home: Eating and drinking  Drink enough fluid to keep your urine pale yellow. Follow instructions from your health care provider about eating or drinking restrictions. Resume your normal diet as told by your health care provider. Avoid heavy or fried foods that are hard to digest. Activity Rest as told by your health care provider. Avoid sitting for a long time without moving. Get up to take short walks every 1-2 hours. This is important to improve blood flow and breathing. Ask for help if you feel weak or unsteady. Return to your normal activities as told by your health care provider. Ask your health care provider what activities are safe for you. Managing cramping and bloating  Try walking around when you have cramps or feel bloated. If directed, apply heat to your abdomen as told by your health care provider. Use the heat source that your health care provider recommends, such as a moist heat pack or a heating pad. Place a towel between your skin and the heat source. Leave the heat on for 20-30 minutes. Remove the heat if your skin turns bright red. This is especially important if you are unable to feel pain,  heat, or cold. You have a greater risk of getting burned. General instructions If you were given a sedative during the procedure, it can affect you for several hours. Do not drive or operate machinery until your health care provider says that it is safe. For the first 24 hours after the procedure: Do not sign important documents. Do not drink alcohol. Do your regular daily activities at a slower pace than normal. Eat soft foods that are easy to digest. Take over-the-counter and prescription medicines only as told by your health care provider. Keep all follow-up visits. This is important. Contact a health care provider if: You have blood in your stool 2-3 days after the procedure. Get help right away if: You have more than a small spotting of blood in your stool. You have large  blood clots in your stool. You have swelling of your abdomen. You have nausea or vomiting. You have a fever. You have increasing pain in your abdomen that is not relieved with medicine. These symptoms may be an emergency. Get help right away. Call 911. Do not wait to see if the symptoms will go away. Do not drive yourself to the hospital. Summary After the procedure, it is common to have a small amount of blood in your stool. You may also have mild cramping and bloating of your abdomen. If you were given a sedative during the procedure, it can affect you for several hours. Do not drive or operate machinery until your health care provider says that it is safe. Get help right away if you have a lot of blood in your stool, nausea or vomiting, a fever, or increased pain in your abdomen. This information is not intended to replace advice given to you by your health care provider. Make sure you discuss any questions you have with your health care provider. Document Revised: 01/18/2021 Document Reviewed: 01/18/2021 Elsevier Patient Education  2023 [employer] Monitored Anesthesia Care, Care After This sheet gives you information about how to care for yourself after your procedure. Your health care provider may also give you more specific instructions. If you have problems or questions, contact your health care provider. What can I expect after the procedure? After the procedure, it is common to have: Tiredness. Forgetfulness about what happened after the procedure. Impaired judgment for important decisions. Nausea or vomiting. Some difficulty with balance. Follow these instructions at home: For the time period you were told by your health care provider:     Rest as needed. Do not participate in activities where you could fall or become injured. Do not drive or use machinery. Do not drink alcohol. Do not take sleeping pills or medicines that cause drowsiness. Do not make important  decisions or sign legal documents. Do not take care of children on your own. Eating and drinking Follow the diet that is recommended by your health care provider. Drink enough fluid to keep your urine pale yellow. If you vomit: Drink water, juice, or soup when you can drink without vomiting. Make sure you have little or no nausea before eating solid foods. General instructions Have a responsible adult stay with you for the time you are told. It is important to have someone help care for you until you are awake and alert. Take over-the-counter and prescription medicines only as told by your health care provider. If you have sleep apnea, surgery and certain medicines can increase your risk for breathing problems. Follow instructions from your health care provider about wearing your sleep  device: Anytime you are sleeping, including during daytime naps. While taking prescription pain medicines, sleeping medicines, or medicines that make you drowsy. Avoid smoking. Keep all follow-up visits as told by your health care provider. This is important. Contact a health care provider if: You keep feeling nauseous or you keep vomiting. You feel light-headed. You are still sleepy or having trouble with balance after 24 hours. You develop a rash. You have a fever. You have redness or swelling around the IV site. Get help right away if: You have trouble breathing. You have new-onset confusion at home. Summary For several hours after your procedure, you may feel tired. You may also be forgetful and have poor judgment. Have a responsible adult stay with you for the time you are told. It is important to have someone help care for you until you are awake and alert. Rest as told. Do not drive or operate machinery. Do not drink alcohol or take sleeping pills. Get help right away if you have trouble breathing, or if you suddenly become confused. This information is not intended to replace advice given to you  by your health care provider. Make sure you discuss any questions you have with your health care provider. Document Revised: 05/02/2021 Document Reviewed: 04/30/2019 Elsevier Patient Education  2023 [employer].

## 2022-02-13 ENCOUNTER — Encounter (HOSPITAL_COMMUNITY)
Admission: RE | Admit: 2022-02-13 | Discharge: 2022-02-13 | Disposition: A | Discharge status: Home / Self Care | Payer: No Typology Code available for payment source | Source: Ambulatory Visit | Attending: Gastroenterology | Admitting: Gastroenterology

## 2022-02-13 DIAGNOSIS — I10 Essential (primary) hypertension: Secondary | ICD-10-CM

## 2022-02-13 NOTE — Progress Notes (Signed)
Cardiology Office Note:    Date:  02/19/2022   ID:  Judy Harrison, DOB 12/12/1965, MRN [id number]  PCP:  Tommie Sams DO  [facility] Providers Cardiologist:  Dina Rich, MD     Referring MD: Annalee Genta, DO   Chief Complaint:  No chief complaint on file.     History of Present Illness:   Judy Harrison is a 56 y.o. female with history of hypertension and palpitations that usually improve with vagal maneuvers.  Echo10/2017 normal LVEF 60 to 65% with mild LVH.   I saw the patient 05/03/20 with elevated BP's after running out of chlorthalidone and metoprolol.     Patient comes in for f/u. Going to start working days and getting off nights. Ran out of chlorthalidone 2 days ogo and BP up. Weight up. Has been eating poorly. BP up a little and now on amlodipine(started by PCP) and metoprolol. Some exercise walking dogs, sometimes 3 miles in the hospital. Lipids elevated and borderline diabetes-trying to work on diet. Working at [employer].    Past Medical History:  Diagnosis Date   Allergy to alpha-gal    Anaphylaxis    Onions   Asthma    Epidermal cyst 02/08/2014   History of kidney stones    Hydronephrosis    Hypertension    Peri-menopausal 02/08/2014   PONV (postoperative nausea and vomiting)    Supraventricular tachycardia (HCC)    UTI (lower urinary tract infection) 02/08/2014   Current Medications: Current Meds  Medication Sig   albuterol (VENTOLIN HFA) 108 (90 Base) MCG/ACT inhaler Inhale 2 puffs into the lungs every 6 (six) hours as needed for wheezing or shortness of breath.   amLODipine (NORVASC) 5 MG tablet Take 1 tablet (5 mg total) by mouth daily.   aspirin 81 MG tablet Take 81 mg by mouth daily.   Biotin w/ Vitamins C & E (HAIR SKIN & NAILS GUMMIES PO) Take 2 each by mouth daily.   cetirizine (ZYRTEC) 10 MG tablet Take 10 mg by mouth daily.   diphenhydrAMINE (BENADRYL) 25 MG tablet Take 1 tablet (25 mg total) by mouth every 6  (six) hours. (Patient taking differently: Take 25 mg by mouth every 6 (six) hours as needed for allergies.)   EPINEPHrine 0.3 mg/0.3 mL IJ SOAJ injection Inject 0.3 mg into the muscle as needed for anaphylaxis.   fluticasone furoate-vilanterol (BREO ELLIPTA) 200-25 MCG/ACT AEPB Inhale 1 puff into the lungs daily.   fluticasone furoate-vilanterol (BREO ELLIPTA) 200-25 MCG/ACT AEPB Inhale 1 puff into the lungs daily.   ibuprofen (ADVIL) 200 MG tablet Take 800 mg by mouth daily as needed for mild pain or moderate pain.    Multiple Vitamin (MULTIVITAMIN) tablet Take 1 tablet by mouth daily.   tetrahydrozoline-zinc (VISINE-AC) 0.05-0.25 % ophthalmic solution 2 drops 3 (three) times daily as needed (eye allergies).   triamcinolone cream (KENALOG) 0.1 % Apply 1 Application topically 2 (two) times daily. Prn rash; use up to 2 weeks   [DISCONTINUED] chlorthalidone (HYGROTON) 25 MG tablet Take 1 tablet (25 mg total) by mouth daily.   [DISCONTINUED] metoprolol tartrate (LOPRESSOR) 25 MG tablet Take 1 tablet (25 mg total) by mouth 2 (two) times daily.    Allergies:   Alpha-gal, Onion, Pork-derived products, Beef-derived products, Cedar, Dilaudid [hydromorphone], Other, and Sulfonamide derivatives   Social History   Tobacco Use   Smoking status: Never   Smokeless tobacco: Never  Vaping Use   Vaping Use: Never  used  Substance Use Topics   Alcohol use: Yes    Comment: occcasional   Drug use: No    Family Hx: The patient's family history includes Alzheimer's disease in her maternal grandmother and paternal grandmother; Anxiety disorder in her daughter; Breast cancer in her maternal aunt; Breast cancer (age of onset: 74) in her mother; Cancer in her maternal grandfather and mother; Diverticulitis in her mother; Heart attack in her paternal grandfather; Heart disease in her paternal grandfather; Hyperlipidemia in her brother and father; Hypertension in her father, maternal grandmother, and mother; Other in  her maternal grandmother, paternal grandmother, son, and son; Prostate cancer in her father.  ROS   EKGs/Labs/Other Test Reviewed:    EKG:  EKG is not ordered today.  The ekg reviewed 02/14/22 NSR 63/m normal EKG  Recent Labs: 12/28/2021: ALT 53; BUN 13; Creatinine, Ser 0.76; Hemoglobin 14.5; Platelets 248; Potassium 3.4; Sodium 138   Recent Lipid Panel Recent Labs    12/28/21 1051  CHOL 284*  TRIG 484*  HDL 36*  LDLCALC 155*     Prior CV Studies:    03/2016 echo Study Conclusions   - Left ventricle: The cavity size was normal. Wall thickness was   increased in a pattern of mild LVH. Systolic function was normal.   The estimated ejection fraction was in the range of 60% to 65%.   Wall motion was normal; there were no regional wall motion   abnormalities. Left ventricular diastolic function parameters   were normal. - Atrial septum: No defect or patent foramen ovale was identified.    Risk Assessment/Calculations/Metrics:          Physical Exam:    VS:  BP (!) 126/90   Pulse 62   Ht 5\' 2"  (1.575 m)   Wt 168 lb (76.2 kg)   LMP 09/28/2015   SpO2 96%   BMI 30.73 kg/m     Wt Readings from Last 3 Encounters:  02/19/22 168 lb (76.2 kg)  02/15/22 162 lb 4.1 oz (73.6 kg)  02/14/22 162 lb 4.1 oz (73.6 kg)    Physical Exam  GEN: Obese, in no acute distress  Neck: no JVD, carotid bruits, or masses Cardiac:RRR; no murmurs, rubs, or gallops  Respiratory:  clear to auscultation bilaterally, normal work of breathing GI: soft, nontender, nondistended, + BS Ext: without cyanosis, clubbing, or edema, Good distal pulses bilaterally Neuro:  Alert and Oriented x 3, Strength and sensation are intact Psych: euthymic mood, full affect       ASSESSMENT & PLAN:   No problem-specific Assessment & Plan notes found for this encounter.   HTN BP high but ran out of chlorthalidone 2 days ago. Eating poorly and no regular exercise. Amlodipine added by PCP and also on metoprolol.  Refill   Palpitations controlled  HLD elevated but wants to work on diet. Coronary calcium score ordered but she hasn't scheduled. I encouraged her to do so as it will help guide lipid therapy.  Borderline DM-diet and exercise           Dispo:  No follow-ups on file.   Medication Adjustments/Labs and Tests Ordered: Current medicines are reviewed at length with the patient today.  Concerns regarding medicines are outlined above.  Tests Ordered: No orders of the defined types were placed in this encounter.  Medication Changes: Meds ordered this encounter  Medications   chlorthalidone (HYGROTON) 25 MG tablet    Sig: Take 1 tablet (25 mg total) by mouth daily.  Dispense:  90 tablet    Refill:  3   metoprolol tartrate (LOPRESSOR) 25 MG tablet    Sig: Take 1 tablet (25 mg total) by mouth 2 (two) times daily.    Dispense:  180 tablet    Refill:  3   Signed, Jacolyn Reedy, PA-C  02/19/2022 12:23 PM    [facility] [address] [place], [place], [place]  [address] Phone: [phone]; Fax: [phone]

## 2022-02-14 ENCOUNTER — Encounter (HOSPITAL_COMMUNITY)
Admission: RE | Admit: 2022-02-14 | Discharge: 2022-02-14 | Disposition: A | Discharge status: Home / Self Care | Payer: No Typology Code available for payment source | Source: Ambulatory Visit | Attending: Gastroenterology | Admitting: Gastroenterology

## 2022-02-14 ENCOUNTER — Encounter (HOSPITAL_COMMUNITY): Payer: Self-pay

## 2022-02-14 DIAGNOSIS — Z1211 Encounter for screening for malignant neoplasm of colon: Secondary | ICD-10-CM | POA: Diagnosis not present

## 2022-02-14 DIAGNOSIS — Z01818 Encounter for other preprocedural examination: Secondary | ICD-10-CM | POA: Diagnosis not present

## 2022-02-14 DIAGNOSIS — I10 Essential (primary) hypertension: Secondary | ICD-10-CM | POA: Diagnosis not present

## 2022-02-14 HISTORY — DX: Personal history of urinary calculi: Z87.442

## 2022-02-14 HISTORY — DX: Unspecified hydronephrosis: N13.30

## 2022-02-14 HISTORY — DX: Nausea with vomiting, unspecified: Z98.890

## 2022-02-14 HISTORY — DX: Nausea with vomiting, unspecified: R11.2

## 2022-02-15 ENCOUNTER — Ambulatory Visit (HOSPITAL_BASED_OUTPATIENT_CLINIC_OR_DEPARTMENT_OTHER): Payer: No Typology Code available for payment source | Admitting: Anesthesiology

## 2022-02-15 ENCOUNTER — Encounter (HOSPITAL_COMMUNITY): Payer: Self-pay | Admitting: Gastroenterology

## 2022-02-15 ENCOUNTER — Ambulatory Visit (HOSPITAL_COMMUNITY): Payer: No Typology Code available for payment source | Admitting: Anesthesiology

## 2022-02-15 ENCOUNTER — Ambulatory Visit (HOSPITAL_COMMUNITY)
Admission: RE | Admit: 2022-02-15 | Discharge: 2022-02-15 | Disposition: A | Discharge status: Home / Self Care | Payer: No Typology Code available for payment source | Attending: Gastroenterology | Admitting: Gastroenterology

## 2022-02-15 ENCOUNTER — Encounter (HOSPITAL_COMMUNITY)
Admission: RE | Disposition: A | Discharge status: Home / Self Care | Payer: Self-pay | Source: Home / Self Care | Attending: Gastroenterology

## 2022-02-15 DIAGNOSIS — K648 Other hemorrhoids: Secondary | ICD-10-CM

## 2022-02-15 DIAGNOSIS — I10 Essential (primary) hypertension: Secondary | ICD-10-CM | POA: Insufficient documentation

## 2022-02-15 DIAGNOSIS — Z8679 Personal history of other diseases of the circulatory system: Secondary | ICD-10-CM | POA: Diagnosis not present

## 2022-02-15 DIAGNOSIS — Z1211 Encounter for screening for malignant neoplasm of colon: Secondary | ICD-10-CM | POA: Insufficient documentation

## 2022-02-15 HISTORY — PX: COLONOSCOPY WITH PROPOFOL: SHX5780

## 2022-02-15 LAB — HM COLONOSCOPY

## 2022-02-15 SURGERY — COLONOSCOPY WITH PROPOFOL
Anesthesia: General

## 2022-02-15 MED ORDER — PHENYLEPHRINE 80 MCG/ML (10ML) SYRINGE FOR IV PUSH (FOR BLOOD PRESSURE SUPPORT)
PREFILLED_SYRINGE | INTRAVENOUS | Status: DC | PRN
  Administered 2022-02-15: 80 ug via INTRAVENOUS

## 2022-02-15 MED ORDER — LACTATED RINGERS IV SOLN
INTRAVENOUS | Status: DC
  Administered 2022-02-15: 1000 mL via INTRAVENOUS

## 2022-02-15 MED ORDER — PROPOFOL 10 MG/ML IV BOLUS
INTRAVENOUS | Status: DC | PRN
  Administered 2022-02-15: 50 mg via INTRAVENOUS

## 2022-02-15 MED ORDER — LIDOCAINE HCL (CARDIAC) PF 100 MG/5ML IV SOSY
PREFILLED_SYRINGE | INTRAVENOUS | Status: DC | PRN
  Administered 2022-02-15: 50 mg via INTRAVENOUS

## 2022-02-15 MED ORDER — EPHEDRINE SULFATE-NACL 50-0.9 MG/10ML-% IV SOSY
PREFILLED_SYRINGE | INTRAVENOUS | Status: DC | PRN
  Administered 2022-02-15 (×3): 5 mg via INTRAVENOUS

## 2022-02-15 MED ORDER — PROPOFOL 500 MG/50ML IV EMUL
INTRAVENOUS | Status: DC | PRN
  Administered 2022-02-15: 200 ug/kg/min via INTRAVENOUS

## 2022-02-15 NOTE — Discharge Instructions (Signed)
You are being discharged to home.  Resume your previous diet.  Your physician has recommended a repeat colonoscopy in 10 years for screening purposes.  

## 2022-02-15 NOTE — Anesthesia Postprocedure Evaluation (Signed)
Anesthesia Post Note  Patient: Judy Harrison  Procedure(s) Performed: COLONOSCOPY WITH PROPOFOL  Patient location during evaluation: Phase II Anesthesia Type: General Level of consciousness: awake and alert and oriented Pain management: pain level controlled Vital Signs Assessment: post-procedure vital signs reviewed and stable Respiratory status: spontaneous breathing, nonlabored ventilation and respiratory function stable Cardiovascular status: blood pressure returned to baseline and stable Postop Assessment: no apparent nausea or vomiting Anesthetic complications: no   No notable events documented.   Last Vitals:  Vitals:   02/15/22 0834 02/15/22 1027  BP: 137/78 104/65  Pulse:  74  Resp: 14 19  Temp: 37.1 C 36.8 C  SpO2: 98% 97%    Last Pain:  Vitals:   02/15/22 1027  TempSrc: Oral  PainSc: 0-No pain                 Judy Harrison C Judy Harrison

## 2022-02-15 NOTE — H&P (Signed)
Judy Harrison is an 56 y.o. female.   Chief Complaint: screening colonoscopy HPI: 56 year old female with past medical history of SVT, alpha gal, hypertension, coming for screening colonoscopy. The patient has never had a colonoscopy in the past.  The patient denies having any complaints such as melena, hematochezia, abdominal pain or distention, change in her bowel movement consistency or frequency, no changes in weight recently.  No family history of colorectal cancer.   Past Medical History:  Diagnosis Date   Allergy to alpha-gal    Anaphylaxis    Onions   Epidermal cyst 02/08/2014   History of kidney stones    Hydronephrosis    Hypertension    Peri-menopausal 02/08/2014   PONV (postoperative nausea and vomiting)    Supraventricular tachycardia (HCC)    UTI (lower urinary tract infection) 02/08/2014    Past Surgical History:  Procedure Laterality Date   CESAREAN SECTION     ENDOMETRIAL ABLATION     stent in kidney     TUBAL LIGATION     uterine abla      Family History  Problem Relation Age of Onset   Breast cancer Mother 27   Cancer Mother    Hypertension Mother    Diverticulitis Mother    Hypertension Father    Hyperlipidemia Father    Prostate cancer Father    Anxiety disorder Daughter    Breast cancer Maternal Aunt    Hypertension Maternal Grandmother    Other Maternal Grandmother        CVA   Alzheimer's disease Maternal Grandmother    Cancer Maternal Grandfather        liver, lung   Other Paternal Grandmother        CVA   Alzheimer's disease Paternal Grandmother    Heart disease Paternal Grandfather    Heart attack Paternal Grandfather    Hyperlipidemia Brother    Other Son        lymphedema   Other Son        lung contusion; liver lac   Social History:  reports that she has never smoked. She has never used smokeless tobacco. She reports current alcohol use. She reports that she does not use drugs.  Allergies:  Allergies  Allergen Reactions    Alpha-Gal Anaphylaxis   Onion Anaphylaxis   Pork-Derived Products Anaphylaxis   Beef-Derived Products Hives   Cedar     Per Allergy Dr   Dilaudid [Hydromorphone] Other (See Comments)    Burning sensation   Other Rash and Other (See Comments)    Cantalope, ragweed, dust, mold, blue dye 106-wheezing   Sulfonamide Derivatives Hives, Swelling and Rash    Medications Prior to Admission  Medication Sig Dispense Refill   albuterol (VENTOLIN HFA) 108 (90 Base) MCG/ACT inhaler Inhale 2 puffs into the lungs every 6 (six) hours as needed for wheezing or shortness of breath. 8 g 2   amLODipine (NORVASC) 5 MG tablet Take 1 tablet (5 mg total) by mouth daily. 90 tablet 3   aspirin 81 MG tablet Take 81 mg by mouth daily.     Biotin w/ Vitamins C & E (HAIR SKIN & NAILS GUMMIES PO) Take 2 each by mouth daily.     cetirizine (ZYRTEC) 10 MG tablet Take 10 mg by mouth daily.     chlorthalidone (HYGROTON) 25 MG tablet Take 1 tablet (25 mg total) by mouth daily. 90 tablet 3   diphenhydrAMINE (BENADRYL) 25 MG tablet Take 1 tablet (25 mg total) by  mouth every 6 (six) hours. (Patient taking differently: Take 25 mg by mouth every 6 (six) hours as needed for allergies.) 12 tablet 0   fluticasone furoate-vilanterol (BREO ELLIPTA) 200-25 MCG/ACT AEPB Inhale 1 puff into the lungs daily. 1 each 12   fluticasone furoate-vilanterol (BREO ELLIPTA) 200-25 MCG/ACT AEPB Inhale 1 puff into the lungs daily. 60 each 12   ibuprofen (ADVIL) 200 MG tablet Take 800 mg by mouth daily as needed for mild pain or moderate pain.      metoprolol tartrate (LOPRESSOR) 25 MG tablet Take 1 tablet (25 mg total) by mouth 2 (two) times daily. 90 tablet 0   Multiple Vitamin (MULTIVITAMIN) tablet Take 1 tablet by mouth daily.     polyethylene glycol-electrolytes (TRILYTE) 420 g solution Take 4,000 mLs by mouth as directed. 4000 mL 0   tetrahydrozoline-zinc (VISINE-AC) 0.05-0.25 % ophthalmic solution 2 drops 3 (three) times daily as needed (eye  allergies).     triamcinolone cream (KENALOG) 0.1 % Apply 1 Application topically 2 (two) times daily. Prn rash; use up to 2 weeks 30 g 0   EPINEPHrine 0.3 mg/0.3 mL IJ SOAJ injection Inject 0.3 mg into the muscle as needed for anaphylaxis. 1 each 3    No results found for this or any previous visit (from the past 48 hour(s)). No results found.  Review of Systems  Constitutional: Negative.   HENT: Negative.    Eyes: Negative.   Respiratory: Negative.    Cardiovascular: Negative.   Gastrointestinal: Negative.   Endocrine: Negative.   Genitourinary: Negative.   Musculoskeletal: Negative.   Skin: Negative.   Allergic/Immunologic: Negative.   Neurological: Negative.   Hematological: Negative.   Psychiatric/Behavioral: Negative.      Blood pressure 137/78, temperature 98.8 F (37.1 C), temperature source Oral, resp. rate 14, height 5' 1.5" (1.562 m), weight 73.6 kg, last menstrual period 09/28/2015, SpO2 98 %. Physical Exam  GENERAL: The patient is AO x3, in no acute distress. HEENT: Head is normocephalic and atraumatic. EOMI are intact. Mouth is well hydrated and without lesions. NECK: Supple. No masses LUNGS: Clear to auscultation. No presence of rhonchi/wheezing/rales. Adequate chest expansion HEART: RRR, normal s1 and s2. ABDOMEN: Soft, nontender, no guarding, no peritoneal signs, and nondistended. BS +. No masses. EXTREMITIES: Without any cyanosis, clubbing, rash, lesions or edema. NEUROLOGIC: AOx3, no focal motor deficit. SKIN: no jaundice, no rashes  Assessment/Plan 56 year old female with past medical history of SVT, alpha gal, hypertension, coming for screening colonoscopy. The patient has never had a colonoscopy in the past.  The patient denies having any complaints such as melena, hematochezia, abdominal pain or distention, change in her bowel movement consistency or frequency, no changes in weight recently.  No family history of colorectal cancer.  Dolores Frame, MD 02/15/2022, 8:49 AM

## 2022-02-15 NOTE — Op Note (Signed)
[facility] Patient Name: Judy Harrison Procedure Date: 02/15/2022 8:46 AM MRN: [id number] Date of Birth: 1966/03/01 Attending MD: Katrinka Blazing ,  CSN: [id number] Age: 56 Admit Type: Outpatient Procedure:                Colonoscopy Indications:              Screening for colorectal malignant neoplasm Providers:                Katrinka Blazing, Crystal Page, Hinton Rao,                            Kristine L. Jessee Avers, Technician Referring MD:              Medicines:                Monitored Anesthesia Care Complications:            No immediate complications. Estimated Blood Loss:     Estimated blood loss: none. Procedure:                Pre-Anesthesia Assessment:                           - Prior to the procedure, a History and Physical                            was performed, and patient medications, allergies                            and sensitivities were reviewed. The patient's                            tolerance of previous anesthesia was reviewed.                           - The risks and benefits of the procedure and the                            sedation options and risks were discussed with the                            patient. All questions were answered and informed                            consent was obtained.                           - ASA Grade Assessment: II - A patient with mild                            systemic disease.                           After obtaining informed consent, the colonoscope                            was passed under direct vision. Throughout the  procedure, the patient's blood pressure, pulse, and                            oxygen saturations were monitored continuously. The                            [id number]-[id number] ([id number]) was introduced through the                            anus and advanced to the the terminal ileum. The                            colonoscopy was performed without difficulty.  The                            patient tolerated the procedure well. The quality                            of the bowel preparation was excellent. Scope In: 10:03:37 AM Scope Out: 10:24:31 AM Scope Withdrawal Time: 0 hours 16 minutes 29 seconds  Total Procedure Duration: 0 hours 20 minutes 54 seconds  Findings:      The perianal and digital rectal examinations were normal.      The terminal ileum appeared normal.      The entire examined colon appeared normal.      Non-bleeding internal hemorrhoids were found during retroflexion. The       hemorrhoids were small. Impression:               - The examined portion of the ileum was normal.                           - The entire examined colon is normal.                           - Non-bleeding internal hemorrhoids.                           - No specimens collected. Moderate Sedation:      Per Anesthesia Care Recommendation:           - Discharge patient to home (ambulatory).                           - Resume previous diet.                           - Repeat colonoscopy in 10 years for screening                            purposes. Procedure Code(s):        --- Professional ---                           Q1975, Colorectal cancer screening; colonoscopy on  individual not meeting criteria for high risk Diagnosis Code(s):        --- Professional ---                           Z12.11, Encounter for screening for malignant                            neoplasm of colon                           K64.8, Other hemorrhoids CPT copyright 2019 American Medical Association. All rights reserved. The codes documented in this report are preliminary and upon coder review may  be revised to meet current compliance requirements. Katrinka Blazing, MD Katrinka Blazing,  02/15/2022 10:32:43 AM This report has been signed electronically. Number of Addenda: 0

## 2022-02-15 NOTE — Anesthesia Preprocedure Evaluation (Addendum)
Anesthesia Evaluation  Patient identified by MRN, date of birth, ID band Patient awake    Reviewed: Allergy & Precautions, NPO status , Patient's Chart, lab work & pertinent test results  History of Anesthesia Complications (+) PONV and history of anesthetic complications  Airway Mallampati: II  TM Distance: >3 FB Neck ROM: Full    Dental  (+) Dental Advisory Given, Teeth Intact   Pulmonary asthma ,    Pulmonary exam normal breath sounds clear to auscultation       Cardiovascular Exercise Tolerance: Good hypertension, Pt. on medications Normal cardiovascular exam+ dysrhythmias Supra Ventricular Tachycardia  Rhythm:Regular Rate:Normal     Neuro/Psych negative neurological ROS  negative psych ROS   GI/Hepatic negative GI ROS, Neg liver ROS,   Endo/Other  negative endocrine ROS  Renal/GU Renal disease (stones)  negative genitourinary   Musculoskeletal negative musculoskeletal ROS (+)   Abdominal   Peds negative pediatric ROS (+)  Hematology negative hematology ROS (+)   Anesthesia Other Findings Allergy to Alpha-gal  Reproductive/Obstetrics negative OB ROS                            Anesthesia Physical Anesthesia Plan  ASA: 2  Anesthesia Plan: General   Post-op Pain Management: Minimal or no pain anticipated   Induction: Intravenous  PONV Risk Score and Plan: Propofol infusion  Airway Management Planned: Nasal Cannula and Natural Airway  Additional Equipment:   Intra-op Plan:   Post-operative Plan:   Informed Consent: I have reviewed the patients History and Physical, chart, labs and discussed the procedure including the risks, benefits and alternatives for the proposed anesthesia with the patient or authorized representative who has indicated his/her understanding and acceptance.     Dental advisory given  Plan Discussed with: CRNA and Surgeon  Anesthesia Plan  Comments:        Anesthesia Quick Evaluation

## 2022-02-15 NOTE — Transfer of Care (Signed)
Immediate Anesthesia Transfer of Care Note  Patient: VASTI YAGI  Procedure(s) Performed: COLONOSCOPY WITH PROPOFOL  Patient Location: PACU  Anesthesia Type:MAC  Level of Consciousness: awake and patient cooperative  Airway & Oxygen Therapy: Patient Spontanous Breathing  Post-op Assessment: Report given to RN and Post -op Vital signs reviewed and stable  Post vital signs: Reviewed and stable  Last Vitals:  Vitals Value Taken Time  BP 104/65 02/15/22 1027  Temp 36.8 C 02/15/22 1027  Pulse 74 02/15/22 1027  Resp 19 02/15/22 1027  SpO2 97 % 02/15/22 1027    Last Pain:  Vitals:   02/15/22 1027  TempSrc: Oral  PainSc: 0-No pain      Patients Stated Pain Goal: 6 (83/25/49 8264)  Complications: No notable events documented.

## 2022-02-19 ENCOUNTER — Ambulatory Visit: Payer: No Typology Code available for payment source | Attending: Physician Assistant | Admitting: Physician Assistant

## 2022-02-19 ENCOUNTER — Encounter: Payer: Self-pay | Admitting: Physician Assistant

## 2022-02-19 ENCOUNTER — Encounter (INDEPENDENT_AMBULATORY_CARE_PROVIDER_SITE_OTHER): Payer: Self-pay | Admitting: *Deleted

## 2022-02-19 VITALS — BP 126/90 | HR 62 | Ht 62.0 in | Wt 168.0 lb

## 2022-02-19 DIAGNOSIS — I10 Essential (primary) hypertension: Secondary | ICD-10-CM

## 2022-02-19 DIAGNOSIS — R7303 Prediabetes: Secondary | ICD-10-CM

## 2022-02-19 DIAGNOSIS — E785 Hyperlipidemia, unspecified: Secondary | ICD-10-CM | POA: Diagnosis not present

## 2022-02-19 DIAGNOSIS — R002 Palpitations: Secondary | ICD-10-CM | POA: Diagnosis not present

## 2022-02-19 MED ORDER — CHLORTHALIDONE 25 MG PO TABS
25.0000 mg | ORAL_TABLET | Freq: Every day | ORAL | 3 refills | Status: DC
  Filled 2022-03-22: qty 90, 90d supply, fill #0
  Filled 2022-06-13: qty 90, 90d supply, fill #1
  Filled 2022-09-12: qty 90, 90d supply, fill #2
  Filled 2022-12-12: qty 60, 60d supply, fill #3

## 2022-02-19 MED ORDER — METOPROLOL TARTRATE 25 MG PO TABS: 25.0000 mg | ORAL_TABLET | Freq: Two times a day (BID) | ORAL | 3 refills | Status: DC

## 2022-02-19 NOTE — Patient Instructions (Addendum)
Medication Instructions:  Your physician recommends that you continue on your current medications as directed. Please refer to the Current Medication list given to you today.    I refilled Chlorthalidone and Metoprolol to [facility]  Labwork: None today  Testing/Procedures: None today  Follow-Up: 1 year with Dr.Branch  Any Other Special Instructions Will Be Listed Below (If Applicable).   150 minutes a week of exercise  If you need a refill on your cardiac medications before your next appointment, please call your pharmacy.

## 2022-02-22 ENCOUNTER — Encounter (HOSPITAL_COMMUNITY): Payer: Self-pay | Admitting: Gastroenterology

## 2022-02-26 ENCOUNTER — Other Ambulatory Visit: Payer: Self-pay | Admitting: Family Medicine

## 2022-02-26 ENCOUNTER — Telehealth: Payer: Self-pay | Admitting: Family Medicine

## 2022-02-26 MED ORDER — PREDNISONE 10 MG PO TABS: ORAL_TABLET | ORAL | 0 refills | Status: DC

## 2022-02-26 NOTE — Telephone Encounter (Signed)
Pt calling in to request Prednisone for poison oak. Pt states she has been using Triamcinolone cream but not helping that much. Poison oak behind ears, on neck and at hairline. Pt states very itchy but no trouble breathing, swelling anywhere or shortness of breath. Pt has 1/2 tube of Triamcinolone cream. Pt states she is unable to get off work this week to come in. Please advise. Thank you.   [facility].

## 2022-02-26 NOTE — Progress Notes (Signed)
Patient experiencing poison oak or poison ivy.  Unable to get into the office.  Placing on prednisone.  [place]

## 2022-02-26 NOTE — Telephone Encounter (Signed)
Left detailed message (per DPR) on voicemail  

## 2022-03-07 ENCOUNTER — Ambulatory Visit (HOSPITAL_COMMUNITY)
Admission: RE | Admit: 2022-03-07 | Discharge: 2022-03-07 | Disposition: A | Discharge status: Home / Self Care | Payer: No Typology Code available for payment source | Source: Ambulatory Visit | Attending: Family Medicine | Admitting: Family Medicine

## 2022-03-07 ENCOUNTER — Other Ambulatory Visit (HOSPITAL_COMMUNITY): Payer: Self-pay

## 2022-03-07 ENCOUNTER — Other Ambulatory Visit: Payer: Self-pay | Admitting: Physician Assistant

## 2022-03-07 DIAGNOSIS — I10 Essential (primary) hypertension: Secondary | ICD-10-CM | POA: Insufficient documentation

## 2022-03-07 DIAGNOSIS — Z8249 Family history of ischemic heart disease and other diseases of the circulatory system: Secondary | ICD-10-CM | POA: Insufficient documentation

## 2022-03-08 ENCOUNTER — Other Ambulatory Visit (HOSPITAL_COMMUNITY): Payer: Self-pay

## 2022-03-22 ENCOUNTER — Other Ambulatory Visit (HOSPITAL_COMMUNITY): Payer: Self-pay

## 2022-04-10 ENCOUNTER — Other Ambulatory Visit (HOSPITAL_COMMUNITY): Payer: Self-pay

## 2022-05-07 ENCOUNTER — Other Ambulatory Visit (HOSPITAL_COMMUNITY): Payer: Self-pay

## 2022-05-14 ENCOUNTER — Other Ambulatory Visit (HOSPITAL_COMMUNITY): Payer: Self-pay

## 2022-06-13 ENCOUNTER — Other Ambulatory Visit: Payer: Self-pay

## 2022-06-13 ENCOUNTER — Other Ambulatory Visit (HOSPITAL_COMMUNITY): Payer: Self-pay

## 2022-06-13 ENCOUNTER — Other Ambulatory Visit: Payer: Self-pay | Admitting: Cardiology

## 2022-06-13 MED ORDER — METOPROLOL TARTRATE 25 MG PO TABS
25.0000 mg | ORAL_TABLET | Freq: Two times a day (BID) | ORAL | 3 refills | Status: DC
  Filled 2022-06-13: qty 180, 90d supply, fill #0

## 2022-07-27 ENCOUNTER — Other Ambulatory Visit (HOSPITAL_COMMUNITY): Payer: Self-pay

## 2022-08-09 ENCOUNTER — Encounter: Payer: Self-pay | Admitting: Radiology

## 2022-09-12 ENCOUNTER — Other Ambulatory Visit: Payer: Self-pay

## 2022-09-14 ENCOUNTER — Other Ambulatory Visit (HOSPITAL_COMMUNITY): Payer: Self-pay

## 2022-10-18 ENCOUNTER — Other Ambulatory Visit: Payer: Self-pay

## 2022-11-09 ENCOUNTER — Other Ambulatory Visit (HOSPITAL_COMMUNITY): Payer: Self-pay

## 2022-11-12 ENCOUNTER — Other Ambulatory Visit (HOSPITAL_COMMUNITY): Payer: Self-pay

## 2022-11-29 ENCOUNTER — Other Ambulatory Visit (HOSPITAL_COMMUNITY): Payer: Self-pay

## 2022-12-12 ENCOUNTER — Telehealth: Payer: Self-pay | Admitting: Cardiology

## 2022-12-12 ENCOUNTER — Other Ambulatory Visit (HOSPITAL_COMMUNITY): Payer: Self-pay

## 2022-12-12 MED ORDER — METOPROLOL TARTRATE 25 MG PO TABS
25.0000 mg | ORAL_TABLET | Freq: Two times a day (BID) | ORAL | 3 refills | Status: DC
  Filled 2022-12-12: qty 180, 90d supply, fill #0
  Filled 2023-04-22: qty 180, 90d supply, fill #1
  Filled 2023-10-14: qty 180, 90d supply, fill #3

## 2022-12-12 MED ORDER — AMLODIPINE BESYLATE 5 MG PO TABS
5.0000 mg | ORAL_TABLET | Freq: Every day | ORAL | 3 refills | Status: DC
  Filled 2022-12-12: qty 90, 90d supply, fill #0

## 2022-12-12 MED ORDER — CHLORTHALIDONE 25 MG PO TABS: 25.0000 mg | ORAL_TABLET | Freq: Every day | ORAL | 3 refills | Status: DC

## 2022-12-12 NOTE — Telephone Encounter (Signed)
*  STAT* If patient is at the pharmacy, call can be transferred to refill team.   1. Which medications need to be refilled? (please list name of each medication and dose if known) amLODipine (NORVASC) 5 MG tablet   chlorthalidone (HYGROTON) 25 MG tablet   metoprolol tartrate (LOPRESSOR) 25 MG tablet   2. Which pharmacy/location (including street and city if local pharmacy) is medication to be sent to?  [place] [facility]    3. Do they need a 30 day or 90 day supply? 90

## 2022-12-12 NOTE — Telephone Encounter (Signed)
Medication refill sent .

## 2023-01-03 ENCOUNTER — Telehealth: Payer: Self-pay | Admitting: Family Medicine

## 2023-01-03 ENCOUNTER — Other Ambulatory Visit (HOSPITAL_COMMUNITY): Payer: Self-pay

## 2023-01-03 ENCOUNTER — Other Ambulatory Visit: Payer: Self-pay | Admitting: Family Medicine

## 2023-01-03 MED ORDER — FLUTICASONE FUROATE-VILANTEROL 200-25 MCG/ACT IN AEPB
1.0000 | INHALATION_SPRAY | Freq: Every day | RESPIRATORY_TRACT | 12 refills | Status: DC
  Filled 2023-01-03: qty 60, 30d supply, fill #0

## 2023-01-03 NOTE — Telephone Encounter (Signed)
Patient requesting refill on fluticasone furoate-vilanterol (BREO ELLIPTA) 200-25 MCG/ACT AEPB send to [facility]  last seen 12/28/21 has appointment 8/7.

## 2023-01-04 ENCOUNTER — Other Ambulatory Visit (HOSPITAL_COMMUNITY): Payer: Self-pay

## 2023-01-04 MED ORDER — FLUTICASONE FUROATE-VILANTEROL 200-25 MCG/ACT IN AEPB
1.0000 | INHALATION_SPRAY | Freq: Every day | RESPIRATORY_TRACT | 12 refills | Status: DC
  Filled 2023-01-04: qty 60, 30d supply, fill #0

## 2023-01-04 NOTE — Telephone Encounter (Signed)
Everlene Other G, DO     Please go ahead and refill.

## 2023-01-16 ENCOUNTER — Other Ambulatory Visit (HOSPITAL_COMMUNITY): Payer: Self-pay

## 2023-01-16 ENCOUNTER — Ambulatory Visit (INDEPENDENT_AMBULATORY_CARE_PROVIDER_SITE_OTHER): Payer: 59 | Admitting: Family Medicine

## 2023-01-16 VITALS — BP 139/84 | HR 56 | Ht 62.0 in | Wt 149.6 lb

## 2023-01-16 DIAGNOSIS — E119 Type 2 diabetes mellitus without complications: Secondary | ICD-10-CM | POA: Diagnosis not present

## 2023-01-16 DIAGNOSIS — E785 Hyperlipidemia, unspecified: Secondary | ICD-10-CM | POA: Diagnosis not present

## 2023-01-16 DIAGNOSIS — J4530 Mild persistent asthma, uncomplicated: Secondary | ICD-10-CM

## 2023-01-16 DIAGNOSIS — I10 Essential (primary) hypertension: Secondary | ICD-10-CM

## 2023-01-16 DIAGNOSIS — Z130 Encounter for screening for diseases of the blood and blood-forming organs and certain disorders involving the immune mechanism: Secondary | ICD-10-CM | POA: Diagnosis not present

## 2023-01-16 MED ORDER — AMLODIPINE BESYLATE 5 MG PO TABS
5.0000 mg | ORAL_TABLET | Freq: Every day | ORAL | 3 refills | Status: DC
  Filled 2023-01-16: qty 90, 90d supply, fill #0
  Filled 2023-04-22: qty 90, 90d supply, fill #1
  Filled 2023-07-17: qty 90, 90d supply, fill #2
  Filled 2023-10-14: qty 90, 90d supply, fill #3

## 2023-01-16 MED ORDER — FLUTICASONE FUROATE-VILANTEROL 200-25 MCG/ACT IN AEPB
1.0000 | INHALATION_SPRAY | Freq: Every day | RESPIRATORY_TRACT | 12 refills | Status: DC
  Filled 2023-01-16: qty 60, 60d supply, fill #0
  Filled 2023-02-12: qty 60, 30d supply, fill #0
  Filled 2023-03-18: qty 60, 30d supply, fill #1
  Filled 2023-04-22: qty 60, 30d supply, fill #2
  Filled 2023-05-22: qty 60, 30d supply, fill #3
  Filled 2023-06-20: qty 60, 30d supply, fill #4
  Filled 2023-07-17: qty 60, 30d supply, fill #5
  Filled 2023-08-19: qty 60, 30d supply, fill #6
  Filled 2023-09-25: qty 60, 30d supply, fill #7
  Filled 2023-10-29: qty 60, 30d supply, fill #8
  Filled 2023-12-04: qty 60, 30d supply, fill #9
  Filled 2024-01-02: qty 60, 30d supply, fill #10

## 2023-01-16 MED ORDER — CHLORTHALIDONE 25 MG PO TABS
25.0000 mg | ORAL_TABLET | Freq: Every day | ORAL | 3 refills | Status: DC
  Filled 2023-01-16 – 2023-01-31 (×2): qty 90, 90d supply, fill #0
  Filled 2023-04-22: qty 90, 90d supply, fill #1
  Filled 2023-07-17: qty 90, 90d supply, fill #2
  Filled 2023-10-14: qty 90, 90d supply, fill #3

## 2023-01-16 NOTE — Assessment & Plan Note (Signed)
Lipid panel today to assess. 

## 2023-01-16 NOTE — Assessment & Plan Note (Signed)
Stable.  Continue current medication

## 2023-01-16 NOTE — Patient Instructions (Signed)
Labs ordered. ? ?Meds refilled. ? ?Follow up in 6 months. ?

## 2023-01-16 NOTE — Assessment & Plan Note (Signed)
Stable. Continue Breo.  

## 2023-01-16 NOTE — Assessment & Plan Note (Signed)
A1c today to assess. 

## 2023-01-16 NOTE — Progress Notes (Signed)
Subjective:  Patient ID: Judy Harrison, female    DOB: 1965/07/28  Age: 57 y.o. MRN: [id number]  CC: Chief Complaint  Patient presents with   Follow-up    Medication follow up    HPI:  57 year old female with alpha gal, hypertension, hyperlipidemia, allergy and asthma presents for follow-up.  Patient has lost weight since her last visit.  Weight down to 149 from 162.  She is eating a diet focused on whole foods.  Patient's blood pressure has significantly improved.  She is doing well at this time on metoprolol, amlodipine, and chlorthalidone.  Patient's previous labs revealed uncontrolled lipids as well as type 2 diabetes.  She elected for lifestyle changes and lieu of medication.  She would like labs to reassess.  She is feeling well.  No chest pain.  Asthma is stable on Breo.  Patient Active Problem List   Diagnosis Date Noted   Type 2 diabetes mellitus without complication, without long-term current use of insulin (HCC) 01/16/2023   History of supraventricular tachycardia 12/28/2021   Asthma 12/28/2021   Allergy to alpha-gal 12/28/2021   Family history of heart disease 12/28/2021   Hyperlipidemia 12/28/2021   Seasonal and perennial allergic rhinitis 03/02/2020   Hypertension 02/25/2020    Social Hx   Social History   Socioeconomic History   Marital status: Married    Spouse name: Not on file   Number of children: Not on file   Years of education: Not on file   Highest education level: Not on file  Occupational History   Occupation: RN  Tobacco Use   Smoking status: Never   Smokeless tobacco: Never  Vaping Use   Vaping status: Never Used  Substance and Sexual Activity   Alcohol use: Yes    Comment: occcasional   Drug use: No   Sexual activity: Yes    Birth control/protection: Surgical    Comment: tubal and ablation  Other Topics Concern   Not on file  Social History Narrative   Not on file   Social Determinants of Health   Financial Resource Strain:  Low Risk  (02/25/2020)   Overall Financial Resource Strain (CARDIA)    Difficulty of Paying Living Expenses: Not hard at all  Food Insecurity: No Food Insecurity (02/25/2020)   Hunger Vital Sign    Worried About Running Out of Food in the Last Year: Never true    Ran Out of Food in the Last Year: Never true  Transportation Needs: No Transportation Needs (02/25/2020)   PRAPARE - Administrator, Civil Service (Medical): No    Lack of Transportation (Non-Medical): No  Physical Activity: Insufficiently Active (02/25/2020)   Exercise Vital Sign    Days of Exercise per Week: 2 days    Minutes of Exercise per Session: 40 min  Stress: Stress Concern Present (02/25/2020)   [employer] of Occupational Health - Occupational Stress Questionnaire    Feeling of Stress : Very much  Social Connections: Unknown (10/24/2021)   Received from [facility]   Social Network    Social Network: Not on file    Review of Systems Per HPI  Objective:  BP 139/84   Pulse (!) 56   Ht 5\' 2"  (1.575 m)   Wt 149 lb 9.6 oz (67.9 kg)   LMP 09/28/2015   SpO2 99%   BMI 27.36 kg/m      01/16/2023    3:39 PM 02/19/2022   11:46 AM 02/15/2022   10:27  AM  BP/Weight  Systolic BP 139 126 104  Diastolic BP 84 90 65  Wt. (Lbs) 149.6 168   BMI 27.36 kg/m2 30.73 kg/m2     Physical Exam Vitals and nursing note reviewed.  Constitutional:      General: She is not in acute distress.    Appearance: Normal appearance.  HENT:     Head: Normocephalic and atraumatic.  Eyes:     General:        Right eye: No discharge.        Left eye: No discharge.     Conjunctiva/sclera: Conjunctivae normal.  Cardiovascular:     Rate and Rhythm: Normal rate and regular rhythm.  Pulmonary:     Effort: Pulmonary effort is normal.     Breath sounds: Normal breath sounds. No wheezing, rhonchi or rales.  Neurological:     Mental Status: She is alert.  Psychiatric:        Mood and Affect: Mood normal.         Behavior: Behavior normal.     Lab Results  Component Value Date   WBC 6.8 12/28/2021   HGB 14.5 12/28/2021   HCT 43.2 12/28/2021   PLT 248 12/28/2021   GLUCOSE 169 (H) 12/28/2021   CHOL 284 (H) 12/28/2021   TRIG 484 (H) 12/28/2021   HDL 36 (L) 12/28/2021   LDLDIRECT 140.0 04/09/2014   LDLCALC 155 (H) 12/28/2021   ALT 53 (H) 12/28/2021   AST 30 12/28/2021   NA 138 12/28/2021   K 3.4 (L) 12/28/2021   CL 94 (L) 12/28/2021   CREATININE 0.76 12/28/2021   BUN 13 12/28/2021   CO2 27 12/28/2021   TSH 3.70 03/22/2016   INR 1.1 04/16/2008   HGBA1C 6.9 (H) 12/28/2021     Assessment & Plan:   Problem List Items Addressed This Visit       Cardiovascular and Mediastinum   Hypertension    Stable.  Continue current medication.      Relevant Medications   amLODipine (NORVASC) 5 MG tablet   chlorthalidone (HYGROTON) 25 MG tablet     Respiratory   Asthma    Stable. Continue Breo.      Relevant Medications   fluticasone furoate-vilanterol (BREO ELLIPTA) 200-25 MCG/ACT AEPB     Endocrine   Type 2 diabetes mellitus without complication, without long-term current use of insulin (HCC) - Primary    A1c today to assess.      Relevant Orders   CMP14+EGFR   Hemoglobin A1c     Other   Hyperlipidemia    Lipid panel today to assess.      Relevant Medications   amLODipine (NORVASC) 5 MG tablet   chlorthalidone (HYGROTON) 25 MG tablet   Other Relevant Orders   Lipid panel   Other Visit Diagnoses     Screening for deficiency anemia       Relevant Orders   CBC       Meds ordered this encounter  Medications   amLODipine (NORVASC) 5 MG tablet    Sig: Take 1 tablet (5 mg total) by mouth daily.    Dispense:  90 tablet    Refill:  3   chlorthalidone (HYGROTON) 25 MG tablet    Sig: Take 1 tablet (25 mg total) by mouth daily.    Dispense:  90 tablet    Refill:  3   fluticasone furoate-vilanterol (BREO ELLIPTA) 200-25 MCG/ACT AEPB    Sig: Inhale 1 puff into the  lungs  daily.    Dispense:  60 each    Refill:  12    Follow-up:  Return in about 6 months (around 07/19/2023).  Everlene Other DO [facility]

## 2023-01-17 ENCOUNTER — Other Ambulatory Visit: Payer: Self-pay

## 2023-01-31 ENCOUNTER — Other Ambulatory Visit (HOSPITAL_COMMUNITY): Payer: Self-pay

## 2023-02-04 NOTE — Addendum Note (Signed)
Addended by: Margaretha Sheffield on: 02/04/2023 03:18 PM   Modules accepted: Orders

## 2023-02-11 LAB — PTH, INTACT AND CALCIUM
Calcium: 10.5 mg/dL — ABNORMAL HIGH (ref 8.7–10.2)
PTH: 52 pg/mL (ref 15–65)

## 2023-02-11 LAB — CALCIUM, IONIZED: Calcium, Ion: 5.3 mg/dL (ref 4.5–5.6)

## 2023-02-12 ENCOUNTER — Other Ambulatory Visit (HOSPITAL_COMMUNITY): Payer: Self-pay

## 2023-03-05 ENCOUNTER — Other Ambulatory Visit: Payer: Self-pay | Admitting: Adult Health

## 2023-03-05 DIAGNOSIS — Z1231 Encounter for screening mammogram for malignant neoplasm of breast: Secondary | ICD-10-CM

## 2023-03-18 ENCOUNTER — Other Ambulatory Visit (HOSPITAL_COMMUNITY): Payer: Self-pay

## 2023-03-26 ENCOUNTER — Ambulatory Visit
Admission: RE | Admit: 2023-03-26 | Discharge: 2023-03-26 | Disposition: A | Discharge status: Home / Self Care | Payer: 59 | Source: Ambulatory Visit | Attending: Adult Health | Admitting: Adult Health

## 2023-03-26 DIAGNOSIS — Z1231 Encounter for screening mammogram for malignant neoplasm of breast: Secondary | ICD-10-CM

## 2023-04-04 ENCOUNTER — Ambulatory Visit: Payer: 59 | Attending: Cardiology | Admitting: Cardiology

## 2023-04-04 ENCOUNTER — Encounter: Payer: Self-pay | Admitting: Cardiology

## 2023-04-04 ENCOUNTER — Other Ambulatory Visit (HOSPITAL_COMMUNITY): Payer: Self-pay

## 2023-04-04 VITALS — BP 140/90 | HR 61 | Ht 62.0 in | Wt 150.4 lb

## 2023-04-04 DIAGNOSIS — I10 Essential (primary) hypertension: Secondary | ICD-10-CM

## 2023-04-04 DIAGNOSIS — R002 Palpitations: Secondary | ICD-10-CM

## 2023-04-04 DIAGNOSIS — E785 Hyperlipidemia, unspecified: Secondary | ICD-10-CM | POA: Diagnosis not present

## 2023-04-04 MED ORDER — ATORVASTATIN CALCIUM 40 MG PO TABS
40.0000 mg | ORAL_TABLET | Freq: Every day | ORAL | 3 refills | Status: DC
  Filled 2023-04-04: qty 90, 90d supply, fill #0
  Filled 2023-07-05: qty 90, 90d supply, fill #1
  Filled 2023-07-17 – 2023-10-14 (×2): qty 90, 90d supply, fill #2
  Filled 2024-02-04: qty 90, 90d supply, fill #3

## 2023-04-04 NOTE — Progress Notes (Signed)
Clinical Summary Judy Harrison is a 57 y.o.female seen today for follow up of the following medical problems.      1. Palpitations -has been on lopressor 25mg  bid - no recent symptoms.      2. HTN - compliant with meds, just took prior to appt.     3. HLD - 02/2022 coronary calcium score 0 - 01/2023 TC 301 TG 203 HDL 48 LDL 213 - has been reluctant to statin statin in the past    4.DM2 - Hgb A1c 6.5 most recently. 1 year ago was 6.9   5. Weight loss - down 18 lbs since 02/2022 - has been using keto diet    SH: works as Database administrator at [employer]  Past Medical History:  Diagnosis Date   Allergy to alpha-gal    Anaphylaxis    Onions   Asthma    Epidermal cyst 02/08/2014   History of kidney stones    Hydronephrosis    Hypertension    Peri-menopausal 02/08/2014   PONV (postoperative nausea and vomiting)    Supraventricular tachycardia (HCC)    UTI (lower urinary tract infection) 02/08/2014     Allergies  Allergen Reactions   Alpha-Gal Anaphylaxis   Onion Anaphylaxis   Pork-Derived Products Anaphylaxis   Beef-Derived Products Hives   Cedar     Per Allergy Dr   Dilaudid [Hydromorphone] Other (See Comments)    Burning sensation   Other Rash and Other (See Comments)    Cantalope, ragweed, dust, mold, blue dye 106-wheezing   Sulfonamide Derivatives Hives, Swelling and Rash     Current Outpatient Medications  Medication Sig Dispense Refill   albuterol (VENTOLIN HFA) 108 (90 Base) MCG/ACT inhaler Inhale 2 puffs into the lungs every 6 (six) hours as needed for wheezing or shortness of breath. 8 g 2   amLODipine (NORVASC) 5 MG tablet Take 1 tablet (5 mg total) by mouth daily. 90 tablet 3   cetirizine (ZYRTEC) 10 MG tablet Take 10 mg by mouth daily.     chlorthalidone (HYGROTON) 25 MG tablet Take 1 tablet (25 mg total) by mouth daily. 90 tablet 3   diphenhydrAMINE (BENADRYL) 25 MG tablet Take 1 tablet (25 mg total) by mouth every 6 (six) hours. (Patient  taking differently: Take 25 mg by mouth every 6 (six) hours as needed for allergies.) 12 tablet 0   EPINEPHrine 0.3 mg/0.3 mL IJ SOAJ injection Inject 0.3 mg into the muscle as needed for anaphylaxis. 1 each 3   fluticasone furoate-vilanterol (BREO ELLIPTA) 200-25 MCG/ACT AEPB Inhale 1 puff into the lungs daily. 60 each 12   ibuprofen (ADVIL) 200 MG tablet Take 800 mg by mouth daily as needed for mild pain or moderate pain.      metoprolol tartrate (LOPRESSOR) 25 MG tablet Take 1 tablet (25 mg total) by mouth 2 (two) times daily. 180 tablet 3   Multiple Vitamin (MULTIVITAMIN) tablet Take 1 tablet by mouth daily.     tetrahydrozoline-zinc (VISINE-AC) 0.05-0.25 % ophthalmic solution 2 drops 3 (three) times daily as needed (eye allergies).     triamcinolone cream (KENALOG) 0.1 % Apply 1 Application topically 2 (two) times daily. Prn rash; use up to 2 weeks 30 g 0   Biotin w/ Vitamins C & E (HAIR SKIN & NAILS GUMMIES PO) Take 2 each by mouth daily. (Patient not taking: Reported on 04/04/2023)     No current facility-administered medications for this visit.     Past Surgical History:  Procedure Laterality Date   CESAREAN SECTION     COLONOSCOPY WITH PROPOFOL N/A 02/15/2022   Procedure: COLONOSCOPY WITH PROPOFOL;  Surgeon: Dolores Frame, MD;  Location: [facility];  Service: Gastroenterology;  Laterality: N/A;  930 ASA 3   ENDOMETRIAL ABLATION     stent in kidney     TUBAL LIGATION     uterine abla       Allergies  Allergen Reactions   Alpha-Gal Anaphylaxis   Onion Anaphylaxis   Pork-Derived Products Anaphylaxis   Beef-Derived Products Hives   Cedar     Per Allergy Dr   Dilaudid [Hydromorphone] Other (See Comments)    Burning sensation   Other Rash and Other (See Comments)    Cantalope, ragweed, dust, mold, blue dye 106-wheezing   Sulfonamide Derivatives Hives, Swelling and Rash      Family History  Problem Relation Age of Onset   Breast cancer Mother 69   Cancer  Mother    Hypertension Mother    Diverticulitis Mother    Hypertension Father    Hyperlipidemia Father    Prostate cancer Father    Anxiety disorder Daughter    Breast cancer Maternal Aunt    Hypertension Maternal Grandmother    Other Maternal Grandmother        CVA   Alzheimer's disease Maternal Grandmother    Cancer Maternal Grandfather        liver, lung   Other Paternal Grandmother        CVA   Alzheimer's disease Paternal Grandmother    Heart disease Paternal Grandfather    Heart attack Paternal Grandfather    Hyperlipidemia Brother    Other Son        lymphedema   Other Son        lung contusion; liver lac     Social History Judy Harrison reports that she has never smoked. She has never used smokeless tobacco. Judy Harrison reports current alcohol use.   Review of Systems CONSTITUTIONAL: No weight loss, fever, chills, weakness or fatigue.  HEENT: Eyes: No visual loss, blurred vision, double vision or yellow sclerae.No hearing loss, sneezing, congestion, runny nose or sore throat.  SKIN: No rash or itching.  CARDIOVASCULAR: per hpi RESPIRATORY: No shortness of breath, cough or sputum.  GASTROINTESTINAL: No anorexia, nausea, vomiting or diarrhea. No abdominal pain or blood.  GENITOURINARY: No burning on urination, no polyuria NEUROLOGICAL: No headache, dizziness, syncope, paralysis, ataxia, numbness or tingling in the extremities. No change in bowel or bladder control.  MUSCULOSKELETAL: No muscle, back pain, joint pain or stiffness.  LYMPHATICS: No enlarged nodes. No history of splenectomy.  PSYCHIATRIC: No history of depression or anxiety.  ENDOCRINOLOGIC: No reports of sweating, cold or heat intolerance. No polyuria or polydipsia.  Marland Kitchen   Physical Examination Today's Vitals   04/04/23 1008 04/04/23 1043  BP: (!) 140/86 (!) 140/90  Pulse: 61   SpO2: 98%   Weight: 150 lb 6.4 oz (68.2 kg)   Height: 5\' 2"  (1.575 m)    Body mass index is 27.51 kg/m.  Gen: resting  comfortably, no acute distress HEENT: no scleral icterus, pupils equal round and reactive, no palptable cervical adenopathy,  CV: RRR, no m/r,g no jvd Resp: Clear to auscultation bilaterally GI: abdomen is soft, non-tender, non-distended, normal bowel sounds, no hepatosplenomegaly MSK: extremities are warm, no edema.  Skin: warm, no rash Neuro:  no focal deficits Psych: appropriate affect   Diagnostic Studies  03/2016 echo Study Conclusions   -  Left ventricle: The cavity size was normal. Wall thickness was   increased in a pattern of mild LVH. Systolic function was normal.   The estimated ejection fraction was in the range of 60% to 65%.   Wall motion was normal; there were no regional wall motion   abnormalities. Left ventricular diastolic function parameters   were normal. - Atrial septum: No defect or patent foramen ovale was identified.   Assessment and Plan  1. Palpitations - well controlled on lopressor, continue current meds - EKG shows SR at 58 today   2. HTN - elevated today, she will update [place] on home bp's in 2 weeks - if needed can titration norvasc  3. HLD - historically has been reluctant to start a statin - we discussed with her DM2 and now that her LDL is >190 she meets 2 class I indications to start a statin - willing to start atorvastatin 40mg  daily, check lipid panel and hepatic panel 6 weeks.  - goal LDL would be <100, can titrate atorva if needed  F/u 1 year      Antoine Poche, M.D.

## 2023-04-04 NOTE — Patient Instructions (Addendum)
Medication Instructions:  Your physician has recommended you make the following change in your medication:   -Start Atorvastatin 40 mg tablet once daily  *If you need a refill on your cardiac medications before your next appointment, please call your pharmacy*   Lab Work: In 6 weeks: -Fasting Lipid Panel- nothing to eat/drink at least 6 hours prior to having lab drawn -Hepatic Panel  If you have labs (blood work) drawn today and your tests are completely normal, you will receive your results only by: MyChart Message (if you have MyChart) OR A paper copy in the mail If you have any lab test that is abnormal or we need to change your treatment, we will call you to review the results.   Testing/Procedures: None   Follow-Up: At [facility], you and your health needs are our priority.  As part of our continuing mission to provide you with exceptional heart care, we have created designated Provider Care Teams.  These Care Teams include your primary Cardiologist (physician) and Advanced Practice Providers (APPs -  Physician Assistants and Nurse Practitioners) who all work together to provide you with the care you need, when you need it.  We recommend signing up for the patient portal called "MyChart".  Sign up information is provided on this After Visit Summary.  MyChart is used to connect with patients for Virtual Visits (Telemedicine).  Patients are able to view lab/test results, encounter notes, upcoming appointments, etc.  Non-urgent messages can be sent to your provider as well.   To learn more about what you can do with MyChart, go to [email or web address].    Your next appointment:   1 year(s)  Provider:   You may see Dina Rich, MD or one of the following Advanced Practice Providers on your designated Care Team:   Randall An, PA-C  Jacolyn Reedy, [place]     Other Instructions Please update our office with blood pressure in 2 weeks.

## 2023-04-22 ENCOUNTER — Other Ambulatory Visit (HOSPITAL_COMMUNITY): Payer: Self-pay

## 2023-05-22 ENCOUNTER — Other Ambulatory Visit (HOSPITAL_COMMUNITY): Payer: Self-pay

## 2023-06-10 ENCOUNTER — Other Ambulatory Visit (HOSPITAL_COMMUNITY): Payer: Self-pay

## 2023-06-20 ENCOUNTER — Other Ambulatory Visit (HOSPITAL_COMMUNITY): Payer: Self-pay

## 2023-07-05 ENCOUNTER — Other Ambulatory Visit: Payer: Self-pay

## 2023-07-17 ENCOUNTER — Other Ambulatory Visit (HOSPITAL_COMMUNITY): Payer: Self-pay

## 2023-07-17 ENCOUNTER — Other Ambulatory Visit: Payer: Self-pay

## 2023-07-18 ENCOUNTER — Other Ambulatory Visit (HOSPITAL_COMMUNITY): Payer: Self-pay

## 2023-07-19 ENCOUNTER — Ambulatory Visit: Payer: 59 | Admitting: Family Medicine

## 2023-08-19 ENCOUNTER — Other Ambulatory Visit (HOSPITAL_COMMUNITY): Payer: Self-pay

## 2023-08-19 ENCOUNTER — Telehealth: Payer: Self-pay | Admitting: *Deleted

## 2023-08-19 NOTE — Telephone Encounter (Unsigned)
 Copied from CRM [id number]. Topic: General - Other >> Aug 19, 2023  1:58 PM Truddie Crumble wrote: Reason for CRM: patient called stating she got into some poison ivy and she need a prednisone 10mg  [facility] [place]

## 2023-08-20 ENCOUNTER — Telehealth: Payer: Self-pay

## 2023-08-20 ENCOUNTER — Other Ambulatory Visit: Payer: Self-pay

## 2023-08-20 ENCOUNTER — Other Ambulatory Visit (HOSPITAL_COMMUNITY): Payer: Self-pay

## 2023-08-20 DIAGNOSIS — E785 Hyperlipidemia, unspecified: Secondary | ICD-10-CM

## 2023-08-20 NOTE — Telephone Encounter (Signed)
 Everlene Other G, DO     Needs to be seen for this.

## 2023-08-20 NOTE — Telephone Encounter (Signed)
 Patient scheduled 08/23/23 at 10:20am with Eber Jones NP

## 2023-08-21 NOTE — Telephone Encounter (Signed)
 Error

## 2023-08-23 ENCOUNTER — Ambulatory Visit: Payer: Self-pay | Admitting: Nurse Practitioner

## 2023-08-23 ENCOUNTER — Other Ambulatory Visit (HOSPITAL_COMMUNITY): Payer: Self-pay

## 2023-08-23 VITALS — BP 132/85 | HR 98 | Temp 98.1°F | Ht 62.0 in | Wt 153.6 lb

## 2023-08-23 DIAGNOSIS — B029 Zoster without complications: Secondary | ICD-10-CM | POA: Diagnosis not present

## 2023-08-23 DIAGNOSIS — L578 Other skin changes due to chronic exposure to nonionizing radiation: Secondary | ICD-10-CM | POA: Diagnosis not present

## 2023-08-23 DIAGNOSIS — J300 Vasomotor rhinitis: Secondary | ICD-10-CM

## 2023-08-23 DIAGNOSIS — L259 Unspecified contact dermatitis, unspecified cause: Secondary | ICD-10-CM | POA: Diagnosis not present

## 2023-08-23 MED ORDER — PREDNISONE 20 MG PO TABS
ORAL_TABLET | ORAL | 0 refills | Status: AC
  Filled 2023-08-23: qty 17, 8d supply, fill #0

## 2023-08-23 MED ORDER — TRIAMCINOLONE ACETONIDE 0.1 % EX CREA
1.0000 | TOPICAL_CREAM | Freq: Two times a day (BID) | CUTANEOUS | 0 refills | Status: AC
  Filled 2023-08-23: qty 30, 15d supply, fill #0

## 2023-08-23 MED ORDER — VALACYCLOVIR HCL 1 G PO TABS
1000.0000 mg | ORAL_TABLET | Freq: Three times a day (TID) | ORAL | 0 refills | Status: DC
  Filled 2023-08-23: qty 21, 7d supply, fill #0

## 2023-08-23 NOTE — Progress Notes (Signed)
 Subjective:    Patient ID: Judy Harrison, female    DOB: 12/10/1965, 58 y.o.   MRN: [id number]  HPI Presents today with a rash on her left neck, beneath left breast, right arm, and right groin since Saturday 08/17/2023. Husband has a similar rash. Rash is itching, and is aggravated when she "feels hot". Reports that the rash is not painful. Has a history of contact dermatitis due to poison oak, and used triamcinolone cream on the rash. Reports that the cream did not improve the rash, but it did make the rash "darker". No fever or fatigue. Has had sinus congestion and pain on the left side of her face for a day now. Has been using sinus rinses and noticed yellow drainage. Has not taken any medication for these symptoms.    Review of Systems  Constitutional:  Negative for chills, fatigue and fever.  HENT:  Positive for congestion and sinus pressure. Negative for sinus pain, sore throat and trouble swallowing.   Respiratory:  Negative for cough, shortness of breath and wheezing.   Cardiovascular:  Negative for chest pain.  Skin:  Positive for rash.       Objective:   Physical Exam Constitutional:      General: She is not in acute distress.    Appearance: Normal appearance. She is not ill-appearing.  HENT:     Right Ear: Hearing, ear canal and external ear normal. Tympanic membrane is retracted. Tympanic membrane is not erythematous.     Left Ear: Hearing, ear canal and external ear normal. Tympanic membrane is retracted. Tympanic membrane is not erythematous.     Nose: Congestion present. No rhinorrhea.     Mouth/Throat:     Mouth: Mucous membranes are moist.     Pharynx: Oropharynx is clear.  Cardiovascular:     Rate and Rhythm: Normal rate and regular rhythm.     Heart sounds: Normal heart sounds. No murmur heard. Pulmonary:     Effort: Pulmonary effort is normal. No respiratory distress.     Breath sounds: Normal breath sounds. No wheezing.  Skin:    General: Skin is warm and dry.      Comments: A line of dry pink maculopapular rash following a linear pattern along a dermatone under the left breast, and the left trapezius muscle.  A larger patch noted on the left posterior neck. Scattered small, dry, pink maculopapular rash with no pattern on the right flexor surface of the arm and right hip area. Of note, significant sun damage noted.   Neurological:     Mental Status: She is alert.  Psychiatric:        Mood and Affect: Mood normal.        Behavior: Behavior normal.        Thought Content: Thought content normal.        Judgment: Judgment normal.    Vitals:   08/23/23 1015  BP: 132/85  Pulse: 98  Temp: 98.1 F (36.7 C)  Height: 5\' 2"  (1.575 m)  Weight: 153 lb 9.6 oz (69.7 kg)  SpO2: 98%  BMI (Calculated): 28.09       Assessment & Plan:  1. Contact dermatitis, unspecified contact dermatitis type, unspecified trigger (Primary) - predniSONE (DELTASONE) 20 MG tablet; Take 3 tablets by mouth daily for 3 days, THEN 2 tablets daily for 3 days, THEN 1 tablet daily for 2 days.  Dispense: 17 tablet; Refill: 0 - triamcinolone cream (KENALOG) 0.1 %; Apply 1 Application topically 2 (two)  times daily as needed for rash. Use up to 2 weeks.  Dispense: 30 g; Refill: 0 -Advised patient to take antihistamines and Famotidine to decrease itching.  -Discussed with patient how this could be contact dermatitis. Due to generalized rash instead of a localized spot, advised a prednisone taper.   2. Herpes zoster without complication - valACYclovir (VALTREX) 1000 MG tablet; Take 1 tablet (1,000 mg total) by mouth 3 (three) times daily.  Dispense: 21 tablet; Refill: 0 -Due to linear pattern of the rash under the left breast and left trapezius muscle, covering for Shingles.   3. Sun-damaged skin -Referral to Dermatology. Patient will explore options and let us know if she needs a referral for skin cancer screening.   4. Vasomotor rhinitis Advised patient to use Flonase, and let us  know if sinus symptoms get worse within the next week.   Return if symptoms worsen or fail to improve.   I have seen and examined this patient alongside the NP student. I have reviewed and verified the student note and agree with the assessment and plan.  Sherie Don, FNP

## 2023-08-24 ENCOUNTER — Encounter: Payer: Self-pay | Admitting: Nurse Practitioner

## 2023-08-25 ENCOUNTER — Encounter: Payer: Self-pay | Admitting: Cardiology

## 2023-09-03 NOTE — Telephone Encounter (Signed)
-   pt was seen for contact dermatitis on 3/14 requesting prednisone refill    Copied from CRM #[id number]. Topic: Clinical - Medical Advice >> Sep 03, 2023 10:54 AM Shelah Lewandowsky wrote: Reason for CRM: itching is coming back from the poison ivy- asking for more prednisone- [phone]

## 2023-09-25 ENCOUNTER — Other Ambulatory Visit: Payer: Self-pay

## 2023-09-25 ENCOUNTER — Encounter: Payer: Self-pay | Admitting: Nurse Practitioner

## 2023-09-25 ENCOUNTER — Other Ambulatory Visit (HOSPITAL_COMMUNITY): Payer: Self-pay

## 2023-09-25 MED ORDER — EPINEPHRINE 0.3 MG/0.3ML IJ SOAJ
0.3000 mg | INTRAMUSCULAR | 3 refills | Status: AC | PRN
  Filled 2023-09-25: qty 2, 2d supply, fill #0
  Filled 2024-02-04: qty 2, 2d supply, fill #1

## 2023-10-31 ENCOUNTER — Ambulatory Visit: Payer: Self-pay

## 2023-10-31 NOTE — Telephone Encounter (Signed)
 Chief Complaint: Fever/Sinus infection Symptoms: see notes Frequency: 2 days ago Pertinent Negatives: Patient denies n/a Disposition: [] ED /[] Urgent Care (no appt availability in office) / [x] Appointment(In office/virtual)/ []  [place] [facility]/ [] Home Care/ [] Refused Recommended Disposition /[] [facility]/ []  Follow-up with PCP Additional Notes: Patient called in stating she has been feeling bad for 2 days with fever, nasal congestion/runniness with yellow drainage, cough, mild sore throat, headache, sinus pain/pressure. Patient appt made for evaluation tomorrow.   Copied from CRM [id number]. Topic: Appointments - Appointment Scheduling >> Oct 31, 2023  2:10 PM Geneva B wrote: Patient/patient representative is calling to schedule an appointment. Refer to attachments for appointment information.  Patient says she has a fever but she has not taking her temp as well as possible sinus infection Reason for Disposition  Earache  Answer Assessment - Initial Assessment Questions 1. LOCATION: "Where does it hurt?"      Over nose and sinus area in face 2. ONSET: "When did the sinus pain start?"  (e.g., hours, days)      2 days ago 3. SEVERITY: "How bad is the pain?"   (Scale 1-10; mild, moderate or severe)   - MILD (1-3): doesn't interfere with normal activities    - MODERATE (4-7): interferes with normal activities (e.g., work or school) or awakens from sleep   - SEVERE (8-10): excruciating pain and patient unable to do any normal activities        Moderate discomfort around nose 5. NASAL CONGESTION: "Is the nose blocked?" If Yes, ask: "Can you open it or must you breathe through your mouth?"     Yes 6. NASAL DISCHARGE: "Do you have discharge from your nose?" If so ask, "What color?"     White and yellow 7. FEVER: "Do you have a fever?" If Yes, ask: "What is it, how was it measured, and when did it start?"      Yes but unsure 8. OTHER SYMPTOMS: "Do you have any other symptoms?"  (e.g., sore throat, cough, earache, difficulty breathing)     Cough, tiny ear discomfort, mild sore throat, drainage  Protocols used: Sinus Pain or Congestion-A-AH

## 2023-11-01 ENCOUNTER — Ambulatory Visit (INDEPENDENT_AMBULATORY_CARE_PROVIDER_SITE_OTHER): Admitting: Physician Assistant

## 2023-11-01 ENCOUNTER — Encounter: Payer: Self-pay | Admitting: Physician Assistant

## 2023-11-01 ENCOUNTER — Other Ambulatory Visit (HOSPITAL_COMMUNITY): Payer: Self-pay

## 2023-11-01 VITALS — BP 125/82 | HR 67 | Temp 98.1°F | Ht 62.0 in | Wt 153.0 lb

## 2023-11-01 DIAGNOSIS — J0110 Acute frontal sinusitis, unspecified: Secondary | ICD-10-CM | POA: Insufficient documentation

## 2023-11-01 MED ORDER — AMOXICILLIN-POT CLAVULANATE 875-125 MG PO TABS
1.0000 | ORAL_TABLET | Freq: Two times a day (BID) | ORAL | 0 refills | Status: AC
  Filled 2023-11-01: qty 14, 7d supply, fill #0

## 2023-11-01 NOTE — Progress Notes (Signed)
   Acute Office Visit  Subjective:     Patient ID: Judy Harrison, female    DOB: 03-04-1966, 58 y.o.   MRN: [id number]   Patient complains of congestion. Symptoms include left ear pressure/pain, facial pain, headache, purulent nasal discharge, and sinus pressure with low grade fevers in the evenings. Onset of symptoms was 4 days ago, gradually worsening since that time. She is drinking plenty of fluids.  Past history is significant for occasional episodes of bronchitis. Patient is non-smoker.       Review of Systems  Constitutional:  Positive for chills, fever and malaise/fatigue.  HENT:  Positive for congestion, ear pain and sinus pain. Negative for ear discharge.   Respiratory:  Negative for cough and shortness of breath.   Cardiovascular:  Negative for chest pain.  Neurological:  Positive for headaches. Negative for dizziness.        Objective:     BP 125/82   Pulse 67   Temp 98.1 F (36.7 C)   Ht 5\' 2"  (1.575 m)   Wt 153 lb (69.4 kg)   LMP 09/28/2015   SpO2 99%   BMI 27.98 kg/m   Physical Exam Vitals reviewed.  Constitutional:      General: She is not in acute distress.    Appearance: Normal appearance.  HENT:     Right Ear: Tympanic membrane normal.     Left Ear: Tympanic membrane normal.     Nose: Congestion present.     Right Sinus: Maxillary sinus tenderness and frontal sinus tenderness present.     Left Sinus: Maxillary sinus tenderness and frontal sinus tenderness present.     Mouth/Throat:     Mouth: Mucous membranes are moist.     Pharynx: Oropharynx is clear.  Eyes:     Extraocular Movements: Extraocular movements intact.     Conjunctiva/sclera: Conjunctivae normal.  Cardiovascular:     Rate and Rhythm: Normal rate and regular rhythm.     Heart sounds: Normal heart sounds. No murmur heard. Pulmonary:     Effort: Pulmonary effort is normal.     Breath sounds: Normal breath sounds.  Skin:    General: Skin is warm and dry.  Neurological:      General: No focal deficit present.     Mental Status: She is alert and oriented to person, place, and time.     No results found for any visits on 11/01/23.      Assessment & Plan:  Acute non-recurrent frontal sinusitis Assessment & Plan: Presentation was consistent with sinusitis.  No evidence of other bacterial infections including pneumonia, pharyngitis, otitis media, or orbital cellulitis. Discussed that this fits the picture of viral vs bacterial sinusitis and that due to type and duration of symptoms and exam findings, we will treat as bacterial sinusitis.  Antibiotics prescribed. Advised to continue ibuprofen  and Tylenol at home. The patient was instructed to return if the worsens in any way, especially if not tolerating fluids, increased sinus pain or swelling, worsening headache, persistent fever, difficulty swallowing or breathing, or as needed. The patient agreed with the plan.    Orders: -     Amoxicillin -Pot Clavulanate; Take 1 tablet by mouth 2 (two) times daily for 7 days.  Dispense: 14 tablet; Refill: 0   Return if symptoms worsen or fail to improve.  Jearlean Mince Helon Wisinski, PA-C

## 2023-11-01 NOTE — Assessment & Plan Note (Signed)
 Presentation was consistent with sinusitis.  No evidence of other bacterial infections including pneumonia, pharyngitis, otitis media, or orbital cellulitis. Discussed that this fits the picture of viral vs bacterial sinusitis and that due to type and duration of symptoms and exam findings, we will treat as bacterial sinusitis.  Antibiotics prescribed. Advised to continue ibuprofen  and Tylenol  at home. The patient was instructed to return if the worsens in any way, especially if not tolerating fluids, increased sinus pain or swelling, worsening headache, persistent fever, difficulty swallowing or breathing, or as needed. The patient agreed with the plan.

## 2023-11-11 ENCOUNTER — Ambulatory Visit: Payer: Self-pay

## 2023-11-11 ENCOUNTER — Encounter: Payer: Self-pay | Admitting: Physician Assistant

## 2023-11-11 NOTE — Telephone Encounter (Signed)
  Chief Complaint: yeast infection Symptoms: redness, itching Frequency: x one day Pertinent Negatives: Patient denies: foul odor, discharge Disposition: [] ED /[] Urgent Care (no appt availability in office) / [x] Appointment(In office/virtual)/ []  [facility]/ [] Home Care/ [] Refused Recommended Disposition /[] [facility]/ []  Follow-up with PCP Additional Notes:  requests Call in RX of diflucan  for yeast infection acquired after ABX (augmentin ) Appointment scheduled in case provider does not want to call in RX Copied from CRM [phone]. Topic: Clinical - Red Word Triage >> Nov 11, 2023 10:53 AM Chrystal Crape R wrote: Pt has a yeast infection after finishing her Antibiotics two days ago. She has sent a message to the office however is in too much discomfort to wait 48 hours for a response. Reason for Disposition  [1] Symptoms of a "yeast infection" (i.e., itchy, white discharge, not bad smelling) AND [2] not improved > 3 days following Care Advice  Answer Assessment - Initial Assessment Questions 1. DISCHARGE: "Describe the discharge." (e.g., white, yellow, green, gray, foamy, cottage cheese-like)    "Yeasty look" 2. ODOR: "Is there a bad odor?"     no 3. ONSET: "When did the discharge begin?"     today 4. RASH: "Is there a rash in the genital area?" If Yes, ask: "Describe it." (e.g., redness, blisters, sores, bumps)     Redness, itching 5. ABDOMEN PAIN: "Are you having any abdomen pain?" If Yes, ask: "What does it feel like? " (e.g., crampy, dull, intermittent, constant)      no 6. ABDOMEN PAIN SEVERITY: If present, ask: "How bad is it?" (e.g., Scale 1-10; mild, moderate, or severe)   - MILD (1-3): Doesn't interfere with normal activities, abdomen soft and not tender to touch.    - MODERATE (4-7): Interferes with normal activities or awakens from sleep, abdomen tender to touch.    - SEVERE (8-10): Excruciating pain, doubled over, unable to do any normal activities. (R/O  peritonitis)      Itchiness 9/10 7. CAUSE: "What do you think is causing the discharge?" "Have you had the same problem before? What happened then?"     Just finished augmentin  8. OTHER SYMPTOMS: "Do you have any other symptoms?" (e.g., fever, itching, vaginal bleeding, pain with urination, injury to genital area, vaginal foreign body)     no  Protocols used: Vaginal Discharge-A-AH

## 2023-11-12 ENCOUNTER — Other Ambulatory Visit: Payer: Self-pay | Admitting: Physician Assistant

## 2023-11-12 ENCOUNTER — Other Ambulatory Visit (HOSPITAL_COMMUNITY): Payer: Self-pay

## 2023-11-12 ENCOUNTER — Ambulatory Visit: Admitting: Physician Assistant

## 2023-11-12 MED ORDER — FLUCONAZOLE 150 MG PO TABS
150.0000 mg | ORAL_TABLET | ORAL | 0 refills | Status: DC
  Filled 2023-11-12: qty 2, 14d supply, fill #0

## 2023-11-12 NOTE — Telephone Encounter (Signed)
 Appt scheduled

## 2024-01-02 ENCOUNTER — Other Ambulatory Visit (HOSPITAL_COMMUNITY): Payer: Self-pay

## 2024-01-02 ENCOUNTER — Other Ambulatory Visit: Payer: Self-pay | Admitting: Family Medicine

## 2024-01-02 ENCOUNTER — Other Ambulatory Visit: Payer: Self-pay

## 2024-01-02 MED ORDER — AMLODIPINE BESYLATE 5 MG PO TABS
5.0000 mg | ORAL_TABLET | Freq: Every day | ORAL | 3 refills | Status: DC
  Filled 2024-01-02: qty 90, 90d supply, fill #0
  Filled 2024-04-08: qty 90, 90d supply, fill #1

## 2024-01-02 MED ORDER — CHLORTHALIDONE 25 MG PO TABS
25.0000 mg | ORAL_TABLET | Freq: Every day | ORAL | 3 refills | Status: DC
  Filled 2024-01-02 – 2024-01-03 (×3): qty 90, 90d supply, fill #0
  Filled 2024-04-08: qty 90, 90d supply, fill #1

## 2024-01-03 ENCOUNTER — Other Ambulatory Visit (HOSPITAL_COMMUNITY): Payer: Self-pay

## 2024-02-04 ENCOUNTER — Other Ambulatory Visit: Payer: Self-pay

## 2024-02-04 ENCOUNTER — Other Ambulatory Visit: Payer: Self-pay | Admitting: Family Medicine

## 2024-02-04 ENCOUNTER — Other Ambulatory Visit (HOSPITAL_COMMUNITY): Payer: Self-pay

## 2024-02-04 MED ORDER — FLUTICASONE FUROATE-VILANTEROL 200-25 MCG/ACT IN AEPB
1.0000 | INHALATION_SPRAY | Freq: Every day | RESPIRATORY_TRACT | 12 refills | Status: AC
  Filled 2024-02-04: qty 60, 30d supply, fill #0
  Filled 2024-03-09: qty 60, 60d supply, fill #1
  Filled 2024-03-10: qty 60, 30d supply, fill #1
  Filled 2024-03-10 (×2): qty 60, 60d supply, fill #1
  Filled 2024-04-08: qty 60, 30d supply, fill #2
  Filled 2024-05-12: qty 60, 30d supply, fill #3
  Filled 2024-06-10: qty 60, 30d supply, fill #4
  Filled 2024-07-17: qty 180, 90d supply, fill #0

## 2024-03-09 ENCOUNTER — Other Ambulatory Visit (HOSPITAL_COMMUNITY): Payer: Self-pay

## 2024-03-10 ENCOUNTER — Other Ambulatory Visit (HOSPITAL_COMMUNITY): Payer: Self-pay

## 2024-03-11 ENCOUNTER — Other Ambulatory Visit (HOSPITAL_COMMUNITY): Payer: Self-pay

## 2024-03-12 ENCOUNTER — Other Ambulatory Visit: Payer: Self-pay

## 2024-03-12 ENCOUNTER — Other Ambulatory Visit (HOSPITAL_COMMUNITY): Payer: Self-pay

## 2024-03-13 ENCOUNTER — Other Ambulatory Visit: Payer: Self-pay | Admitting: Physician Assistant

## 2024-03-16 ENCOUNTER — Other Ambulatory Visit (HOSPITAL_COMMUNITY): Payer: Self-pay

## 2024-03-16 ENCOUNTER — Encounter (HOSPITAL_COMMUNITY): Payer: Self-pay

## 2024-04-28 ENCOUNTER — Other Ambulatory Visit: Payer: Self-pay | Admitting: Cardiology

## 2024-04-30 ENCOUNTER — Other Ambulatory Visit (HOSPITAL_COMMUNITY): Payer: Self-pay

## 2024-04-30 MED ORDER — ATORVASTATIN CALCIUM 40 MG PO TABS
40.0000 mg | ORAL_TABLET | Freq: Every day | ORAL | 0 refills | Status: DC
  Filled 2024-04-30: qty 30, 30d supply, fill #0

## 2024-05-13 ENCOUNTER — Other Ambulatory Visit (HOSPITAL_COMMUNITY): Payer: Self-pay

## 2024-06-02 ENCOUNTER — Encounter: Payer: Self-pay | Admitting: Nurse Practitioner

## 2024-06-03 ENCOUNTER — Other Ambulatory Visit: Payer: Self-pay | Admitting: Nurse Practitioner

## 2024-06-03 MED ORDER — AMOXICILLIN-POT CLAVULANATE 875-125 MG PO TABS: 1.0000 | ORAL_TABLET | Freq: Two times a day (BID) | ORAL | 0 refills | Status: DC

## 2024-06-03 NOTE — Telephone Encounter (Signed)
 Spoke with patient. Antibiotics sent in. Office visit if worsens or persists.

## 2024-06-10 ENCOUNTER — Other Ambulatory Visit: Payer: Self-pay | Admitting: Cardiology

## 2024-06-12 ENCOUNTER — Other Ambulatory Visit (HOSPITAL_COMMUNITY): Payer: Self-pay

## 2024-06-12 MED ORDER — ATORVASTATIN CALCIUM 40 MG PO TABS
40.0000 mg | ORAL_TABLET | Freq: Every day | ORAL | 0 refills | Status: DC
  Filled 2024-06-12: qty 15, 15d supply, fill #0

## 2024-06-15 ENCOUNTER — Other Ambulatory Visit (HOSPITAL_COMMUNITY): Payer: Self-pay

## 2024-06-23 ENCOUNTER — Ambulatory Visit: Attending: Student | Admitting: Student

## 2024-06-23 ENCOUNTER — Encounter: Payer: Self-pay | Admitting: Student

## 2024-06-23 ENCOUNTER — Other Ambulatory Visit (HOSPITAL_COMMUNITY): Payer: Self-pay

## 2024-06-23 VITALS — BP 122/84 | HR 70 | Ht 62.5 in | Wt 155.0 lb

## 2024-06-23 DIAGNOSIS — E785 Hyperlipidemia, unspecified: Secondary | ICD-10-CM | POA: Diagnosis not present

## 2024-06-23 DIAGNOSIS — R002 Palpitations: Secondary | ICD-10-CM | POA: Diagnosis not present

## 2024-06-23 DIAGNOSIS — I10 Essential (primary) hypertension: Secondary | ICD-10-CM | POA: Diagnosis not present

## 2024-06-23 DIAGNOSIS — R7303 Prediabetes: Secondary | ICD-10-CM | POA: Diagnosis not present

## 2024-06-23 DIAGNOSIS — Z79899 Other long term (current) drug therapy: Secondary | ICD-10-CM

## 2024-06-23 MED ORDER — ATORVASTATIN CALCIUM 40 MG PO TABS
40.0000 mg | ORAL_TABLET | Freq: Every day | ORAL | 3 refills | Status: AC
  Filled 2024-06-23 – 2024-06-24 (×2): qty 90, 90d supply, fill #0

## 2024-06-23 MED ORDER — CHLORTHALIDONE 25 MG PO TABS
25.0000 mg | ORAL_TABLET | Freq: Every day | ORAL | 3 refills | Status: AC
  Filled 2024-06-23 – 2024-06-24 (×2): qty 90, 90d supply, fill #0

## 2024-06-23 MED ORDER — METOPROLOL TARTRATE 25 MG PO TABS
25.0000 mg | ORAL_TABLET | Freq: Two times a day (BID) | ORAL | 3 refills | Status: AC
  Filled 2024-06-23 – 2024-07-17 (×2): qty 180, 90d supply, fill #0

## 2024-06-23 MED ORDER — AMLODIPINE BESYLATE 5 MG PO TABS
5.0000 mg | ORAL_TABLET | Freq: Every day | ORAL | 3 refills | Status: AC
  Filled 2024-06-23 – 2024-06-24 (×2): qty 90, 90d supply, fill #0

## 2024-06-23 NOTE — Progress Notes (Unsigned)
 "  Cardiology Office Note    Date:  06/24/2024  ID:  Judy Harrison, DOB [birth year], MRN [id number] Cardiologist: Alvan Carrier, MD { : History of Present Illness:    Judy Harrison is a 59 y.o. female with past medical history of palpitations, HTN, HLD, prediabetes and coronary calcium  score of 0 in 02/2022 who presents to the office today for annual follow-up.  She was last examined by Dr. Alvan in 03/2023 and denied any recent palpitations or anginal symptoms at that time. Was encouraged to follow BP at home as this was elevated during the time of her office visit. Was started on Atorvastatin  40 mg daily with plans for follow-up FLP and LFT's in 6 to 8 weeks. It does not appear that follow-up labs were obtained.  In talking with the patient today, she reports overall doing well from a cardiac perspective. Says that her palpitations have overall been well-controlled. She denies any exertional chest pain or progressive dyspnea on exertion. She does exercise at the gym and walks her dogs for exercise as well. Has been under increased stress given her responsibilities at work and has also been taking care of her parents who live in [place] and her in-laws who live across the street. She has not had routine labs since 2024.  Studies Reviewed:   EKG: EKG is ordered today and demonstrates:   EKG Interpretation Date/Time:  Tuesday June 23 2024 15:45:58 EST Ventricular Rate:  70 PR Interval:  170 QRS Duration:  88 QT Interval:  400 QTC Calculation: 432 R Axis:   57  Text Interpretation: Normal sinus rhythm Low voltage QRS Confirmed by Johnson Grate ([id number]) on 06/23/2024 3:54:08 PM       Echocardiogram: 03/2016 Study Conclusions   - Left ventricle: The cavity size was normal. Wall thickness was    increased in a pattern of mild LVH. Systolic function was normal.    The estimated ejection fraction was in the range of 60% to 65%.    Wall motion was normal; there were no regional  wall motion    abnormalities. Left ventricular diastolic function parameters    were normal.  - Atrial septum: No defect or patent foramen ovale was identified.   Cardiac CT Scoring: 02/2022 FINDINGS: Coronary arteries: Normal origins.   Coronary Calcium  Score:   Left main: 0   Left anterior descending artery: 0   Left circumflex artery: 0   Right coronary artery: 0   Total: 0   Percentile: 0   Pericardium: Normal.   Aorta: Normal caliber.   Non-cardiac: See separate report from [facility].   IMPRESSION: Coronary calcium  score of 0. This is a low risk study.   Physical Exam:   VS:  BP 122/84 (BP Location: Right Arm, Cuff Size: Large)   Pulse 70   Ht 5' 2.5 (1.588 m)   Wt 155 lb (70.3 kg)   LMP 09/28/2015   SpO2 96%   BMI 27.90 kg/m    Wt Readings from Last 3 Encounters:  06/23/24 155 lb (70.3 kg)  11/01/23 153 lb (69.4 kg)  08/23/23 153 lb 9.6 oz (69.7 kg)     GEN: Pleasant female appearing in no acute distress NECK: No JVD; No carotid bruits CARDIAC: RRR, no murmurs, rubs, gallops RESPIRATORY:  Clear to auscultation without rales, wheezing or rhonchi  ABDOMEN: Appears non-distended. No obvious abdominal masses. EXTREMITIES: No clubbing or cyanosis. No pitting edema.  Distal pedal pulses are 2+ bilaterally.   Assessment and Plan:  1. Palpitations - She reports her symptoms have overall been well-controlled. She is NSR by examination and EKG today. Continue current medical therapy with Lopressor  25 mg twice daily.  2. Essential hypertension - BP is at 122/84 during today's visit. Continue current medical therapy with Amlodipine  5 mg daily, Chlorthalidone  25 mg daily and Lopressor  25 mg twice daily. Will obtain a follow-up CMET for reassessment of electrolytes and renal function.  3. Hyperlipidemia, unspecified hyperlipidemia type - Most recent FLP on file is from 01/2023 and showed total cholesterol 301, triglycerides 203 and LDL 213.  Will  recheck an FLP and LFT's. Remains on Atorvastatin  40 mg daily.  4. Prediabetes - Hgb A1c was at 6.5 in 01/2023 and she reports making dietary changes with associated weight loss in the interim. Will recheck a Hgb A1c with upcoming labs.   Signed, Laymon CHRISTELLA Qua, PA-C   "

## 2024-06-23 NOTE — Patient Instructions (Signed)
 Medication Instructions:  Your physician recommends that you continue on your current medications as directed. Please refer to the Current Medication list given to you today.  *If you need a refill on your cardiac medications before your next appointment, please call your pharmacy*  Lab Work: Your physician recommends that you return for lab work in: ( Fasting ) at [facility].   If you have labs (blood work) drawn today and your tests are completely normal, you will receive your results only by: MyChart Message (if you have MyChart) OR A paper copy in the mail If you have any lab test that is abnormal or we need to change your treatment, we will call you to review the results.  Testing/Procedures: NONE   Follow-Up: At [facility], you and your health needs are our priority.  As part of our continuing mission to provide you with exceptional heart care, our providers are all part of one team.  This team includes your primary Cardiologist (physician) and Advanced Practice Providers or APPs (Physician Assistants and Nurse Practitioners) who all work together to provide you with the care you need, when you need it.  Your next appointment:   1 year(s)  Provider:   Dorn Ross, MD    We recommend signing up for the patient portal called MyChart.  Sign up information is provided on this After Visit Summary.  MyChart is used to connect with patients for Virtual Visits (Telemedicine).  Patients are able to view lab/test results, encounter notes, upcoming appointments, etc.  Non-urgent messages can be sent to your provider as well.   To learn more about what you can do with MyChart, go to [email or web address].   Other Instructions Thank you for choosing [facility]!

## 2024-06-24 ENCOUNTER — Encounter: Payer: Self-pay | Admitting: Student

## 2024-06-24 ENCOUNTER — Other Ambulatory Visit: Payer: Self-pay

## 2024-06-24 ENCOUNTER — Other Ambulatory Visit (HOSPITAL_COMMUNITY): Payer: Self-pay

## 2024-07-17 ENCOUNTER — Other Ambulatory Visit (HOSPITAL_COMMUNITY): Payer: Self-pay

## 2024-07-17 ENCOUNTER — Other Ambulatory Visit: Payer: Self-pay
